# Patient Record
Sex: Male | Born: 1985 | Race: Black or African American | Hispanic: No | Marital: Single | Smoking: Former smoker
Health system: Southern US, Community
[De-identification: ages and names within clinical notes are randomized; demographics above are authoritative.]

## PROBLEM LIST (undated history)

## (undated) DIAGNOSIS — F32A Depression, unspecified: Secondary | ICD-10-CM

## (undated) DIAGNOSIS — F419 Anxiety disorder, unspecified: Secondary | ICD-10-CM

## (undated) HISTORY — PX: APPENDECTOMY: SHX54

---

## 2004-03-22 ENCOUNTER — Emergency Department (HOSPITAL_COMMUNITY): Admission: EM | Admit: 2004-03-22 | Discharge: 2004-03-22 | Payer: Self-pay | Admitting: Family Medicine

## 2007-07-26 ENCOUNTER — Emergency Department (HOSPITAL_COMMUNITY): Admission: EM | Admit: 2007-07-26 | Discharge: 2007-07-26 | Payer: Self-pay | Admitting: Emergency Medicine

## 2009-04-08 ENCOUNTER — Emergency Department (HOSPITAL_COMMUNITY): Admission: EM | Admit: 2009-04-08 | Discharge: 2009-04-08 | Payer: Self-pay | Admitting: Family Medicine

## 2011-03-07 LAB — INFLUENZA A+B VIRUS AG-DIRECT(RAPID)
Inflenza A Ag: NEGATIVE
Influenza B Ag: NEGATIVE

## 2011-03-07 LAB — RAPID STREP SCREEN (MED CTR MEBANE ONLY): Streptococcus, Group A Screen (Direct): NEGATIVE

## 2011-05-24 ENCOUNTER — Encounter: Payer: Self-pay | Admitting: Nurse Practitioner

## 2011-05-24 ENCOUNTER — Emergency Department (HOSPITAL_COMMUNITY)
Admission: EM | Admit: 2011-05-24 | Discharge: 2011-05-24 | Discharge status: Against Medical Advice | Payer: Self-pay | Attending: Emergency Medicine | Admitting: Emergency Medicine

## 2011-05-24 DIAGNOSIS — Z0389 Encounter for observation for other suspected diseases and conditions ruled out: Secondary | ICD-10-CM | POA: Insufficient documentation

## 2011-05-24 NOTE — ED Notes (Signed)
States "Feeling funny" in his pelvic area x 2 days. Wants std check

## 2011-05-24 NOTE — ED Notes (Signed)
Called for pt with no answer in triage and wait

## 2011-05-24 NOTE — ED Notes (Signed)
Pt called in triage waiting area as well as main ED waiting area with no response 

## 2011-05-24 NOTE — ED Notes (Signed)
Called patient X 2 no answer

## 2011-05-29 ENCOUNTER — Encounter (HOSPITAL_COMMUNITY): Payer: Self-pay | Admitting: Emergency Medicine

## 2011-05-29 ENCOUNTER — Emergency Department (HOSPITAL_COMMUNITY)
Admission: EM | Admit: 2011-05-29 | Discharge: 2011-05-29 | Disposition: A | Discharge status: Home / Self Care | Payer: Self-pay | Attending: Emergency Medicine | Admitting: Emergency Medicine

## 2011-05-29 DIAGNOSIS — R109 Unspecified abdominal pain: Secondary | ICD-10-CM | POA: Insufficient documentation

## 2011-05-29 DIAGNOSIS — N451 Epididymitis: Secondary | ICD-10-CM

## 2011-05-29 DIAGNOSIS — N453 Epididymo-orchitis: Secondary | ICD-10-CM | POA: Insufficient documentation

## 2011-05-29 DIAGNOSIS — N509 Disorder of male genital organs, unspecified: Secondary | ICD-10-CM | POA: Insufficient documentation

## 2011-05-29 LAB — URINALYSIS, ROUTINE W REFLEX MICROSCOPIC
Bilirubin Urine: NEGATIVE
Glucose, UA: NEGATIVE mg/dL
Ketones, ur: 15 mg/dL — AB
pH: 6 (ref 5.0–8.0)

## 2011-05-29 LAB — URINE MICROSCOPIC-ADD ON

## 2011-05-29 MED ORDER — CEFTRIAXONE SODIUM 250 MG IJ SOLR
250.0000 mg | Freq: Once | INTRAMUSCULAR | Status: AC
  Administered 2011-05-29: 250 mg via INTRAMUSCULAR
  Filled 2011-05-29: qty 250

## 2011-05-29 MED ORDER — DOXYCYCLINE HYCLATE 100 MG PO CAPS: 100.0000 mg | ORAL_CAPSULE | Freq: Two times a day (BID) | ORAL | Status: AC

## 2011-05-29 NOTE — ED Notes (Signed)
Pt c/o lower abd pain.  Thinks he has STD

## 2011-05-29 NOTE — ED Notes (Signed)
Pt here with c/o lower abd pain that started on Monday.  Pt reports recent unsafe sex and worries that he may have an std.  Pt alert and oriented x4 and in NAD at this time.

## 2011-05-29 NOTE — ED Notes (Signed)
Resident at the bedside. Pt had several questions regarding discharge. Sent home with prescription. No signs of distress at the time.

## 2011-05-29 NOTE — ED Provider Notes (Signed)
History     CSN: [id number] Arrival date & time: 05/29/2011  3:51 PM   First MD Initiated Contact with Patient 05/29/11 1647      Chief Complaint  Patient presents with  . Abdominal Pain    (Consider location/radiation/quality/duration/timing/severity/associated sxs/prior treatment) The history is provided by the patient.   patient is a 25 year old male who presents with genital discomfort. This started about 4 days ago. Was initially mild. It has been constant and waxes and wanes. He describes the discomfort as mild right testicular pain that sometimes radiates up towards his suprapubic area. He also had notes intermittent unusual sensation in his skin over his genitalia. He denies associated dysuria, urethral discharge, difficulty urinating, nausea, vomiting, diarrhea, fever, rash, arthralgias, systemic chills. He had unprotected intercourse with a male about 10 days prior to arrival. Otherwise, patient typically uses condoms. Overall severity is described as mild.  Nothing noted to make it better or worse.  Patient did have urinary GC and Chlamydia screen done student services 2 days ago. Also had HIV testing which he reports was negative.  History reviewed. No pertinent past medical history.  History reviewed. No pertinent past surgical history.  No family history on file.  History  Substance Use Topics  . Smoking status: Current Everyday Smoker -- 1.0 packs/day  . Smokeless tobacco: Not on file  . Alcohol Use: No     rare      Review of Systems  Constitutional: Negative for fever, chills and activity change.  HENT: Negative for congestion and neck pain.   Respiratory: Negative for cough, chest tightness, shortness of breath and wheezing.   Cardiovascular: Negative for chest pain.  Gastrointestinal: Negative for nausea, vomiting, abdominal pain, diarrhea and abdominal distention.  Genitourinary: Negative for difficulty urinating.  Musculoskeletal: Negative for gait  problem.  Skin: Negative for rash.  Neurological: Negative for weakness and numbness.  Psychiatric/Behavioral: Negative for behavioral problems and confusion.  All other systems reviewed and are negative.    Allergies  Review of patient's allergies indicates no known allergies.  Home Medications   Current Outpatient Rx  Name Route Sig Dispense Refill  . DOXYCYCLINE HYCLATE 100 MG PO CAPS Oral Take 1 capsule (100 mg total) by mouth 2 (two) times daily. 20 capsule 0    BP 120/69  Pulse 56  Temp(Src) 98 F (36.7 C) (Oral)  Resp 16  SpO2 100%  Physical Exam  Nursing note and vitals reviewed. Constitutional: He is oriented to person, place, and time. He appears well-developed and well-nourished. No distress.  HENT:  Head: Normocephalic.  Nose: Nose normal.  Eyes: EOM are normal. Pupils are equal, round, and reactive to light.  Neck: Normal range of motion.  Cardiovascular: Regular rhythm and intact distal pulses.   Pulmonary/Chest: Effort normal. No respiratory distress.  Abdominal: He exhibits no distension. There is no tenderness.       No visible injury  Genitourinary: Penis normal. No penile tenderness.       No lesions noted on penis or other genitalia. No penile discharge. Mild tenderness to palpation over right epididymis. No testicular asymmetry. No masses noted. No left testicular tenderness.  Musculoskeletal: Normal range of motion. He exhibits no edema and no tenderness.  Neurological: He is alert and oriented to person, place, and time.       Normal strength  Skin: Skin is warm and dry. No rash noted. He is not diaphoretic.  Psychiatric: He has a normal mood and affect. His behavior is normal.  Thought content normal.   no inguinal hernias  ED Course  Procedures (including critical care time)  Labs Reviewed  URINALYSIS, ROUTINE W REFLEX MICROSCOPIC - Abnormal; Notable for the following:    Ketones, ur 15 (*)    Protein, ur 30 (*)    All other components  within normal limits  URINE MICROSCOPIC-ADD ON  GC/CHLAMYDIA PROBE AMP, GENITAL   No results found.   1. Epididymitis       MDM   Patient patient here with vague genital complaints and he is concerned he has an STD. On exam his right testicle is mildly tender over his epididymis. No mass or testicular asymmetry noted. The timing and features of patient's complaint are not suggestive of testicular torsion. He also has a soft and nontender abdomen. There is no evidence of intra-abdominal etiology. Patient is at risk of STD based on unprotected intercourse. With this in mind and tenderness over epididymis, will treat as gonococcal epididymitis. We'll also treat for Chlamydia. Patient is concerned that he may have herpes. This is unlikely. Patient has not had classic prodromal symptoms, vesicular lesions, nor systemic symptoms. I discussed the inability to test for herpes without lesion to culture. I discussed what to look out for as well as appropriate followup was to health if he remains concerned. Patient overall is well appearing and stable for outpatient follow up.       Milus Glazier 05/30/11 0105

## 2011-05-30 LAB — GC/CHLAMYDIA PROBE AMP, GENITAL
Chlamydia, DNA Probe: NEGATIVE
GC Probe Amp, Genital: NEGATIVE

## 2011-05-30 NOTE — ED Provider Notes (Signed)
  I performed a history and physical examination of Javier Garza and discussed his management with Dr. Zebedee Iba.  I agree with the history, physical, assessment, and plan of care, with the following exceptions: None  I was present for the following procedures: None Time Spent in Critical Care of the patient: None   Manus Rudd, MD 05/30/11 1148

## 2011-10-09 ENCOUNTER — Ambulatory Visit (INDEPENDENT_AMBULATORY_CARE_PROVIDER_SITE_OTHER): Payer: BC Managed Care – PPO | Admitting: Physician Assistant

## 2011-10-09 VITALS — BP 125/80 | HR 70 | Temp 97.8°F | Resp 16 | Ht 72.0 in | Wt 164.6 lb

## 2011-10-09 DIAGNOSIS — N342 Other urethritis: Secondary | ICD-10-CM

## 2011-10-09 LAB — POCT URINALYSIS DIPSTICK
Leukocytes, UA: NEGATIVE
Urobilinogen, UA: 1

## 2011-10-09 LAB — POCT UA - MICROSCOPIC ONLY: Yeast, UA: NEGATIVE

## 2011-10-09 LAB — RPR

## 2011-10-09 NOTE — Progress Notes (Signed)
  Subjective:    Patient ID: Javier Garza, male    DOB: 1986/03/01, 26 y.o.   MRN: [id number]  HPI Javier Garza comes in today requesting STD screening.  Although he has no specific symptoms, he has a sense of penile or urethral irritation on and off for 1 month.  He has had at least 2 partners in that month and had unprotected sex with each.  He states that he has no dysuria, discharge, lesions, pain.  He had STD testing 3 months ago that was normal and included HIV, RPR and Gen Probe.  He says he has never had an STD that he knows of. He is healthy otherwise   Review of Systems As above    Objective:   Physical Exam  Constitutional: He appears well-developed and well-nourished.  Cardiovascular: Normal rate, regular rhythm and normal heart sounds.   Genitourinary: Testes normal and penis normal. Circumcised.       No discharge or irritation noted at meatus.  No lesions noted.  Lymphadenopathy:       Right: No inguinal adenopathy present.       Left: No inguinal adenopathy present.  Skin: Skin is warm.      Results for orders placed in visit on 10/09/11  POCT URINALYSIS DIPSTICK      Component Value Range   Color, UA yellow     Clarity, UA clear     Glucose, UA neg     Bilirubin, UA neg     Ketones, UA neg     Spec Grav, UA 1.015     Blood, UA neg     pH, UA 6.5     Protein, UA 30mg      Urobilinogen, UA 1.0     Nitrite, UA neg     Leukocytes, UA Negative    POCT UA - MICROSCOPIC ONLY      Component Value Range   WBC, Ur, HPF, POC 0-1     RBC, urine, microscopic 0-1     Bacteria, U Microscopic neg     Mucus, UA neg     Epithelial cells, urine per micros 0-1     Crystals, Ur, HPF, POC pos-oxalate     Casts, Ur, LPF, POC neg     Yeast, UA neg         Assessment & Plan:  High Risk Sexual Activity STD screening  Reviewed all tests that can be ordered.  He only wants UriProbe, HIV, RPR and declines Hep B and C, HSV titers.  Encouraged to use condoms every time!!! Encouraged  to hydrate and drink more water.

## 2011-10-10 LAB — HIV ANTIBODY (ROUTINE TESTING W REFLEX): HIV: NONREACTIVE

## 2011-10-10 LAB — GC/CHLAMYDIA PROBE AMP, URINE: Chlamydia, Swab/Urine, PCR: NEGATIVE

## 2011-12-17 ENCOUNTER — Encounter (HOSPITAL_COMMUNITY): Payer: Self-pay | Admitting: Emergency Medicine

## 2011-12-17 ENCOUNTER — Emergency Department (HOSPITAL_COMMUNITY)
Admission: EM | Admit: 2011-12-17 | Discharge: 2011-12-17 | Discharge status: Against Medical Advice | Payer: Self-pay | Attending: Emergency Medicine | Admitting: Emergency Medicine

## 2011-12-17 DIAGNOSIS — F489 Nonpsychotic mental disorder, unspecified: Secondary | ICD-10-CM

## 2011-12-17 DIAGNOSIS — Z008 Encounter for other general examination: Secondary | ICD-10-CM | POA: Insufficient documentation

## 2011-12-17 NOTE — ED Notes (Addendum)
Pt is saying that he is not suicidal and is wanting to leave. Pt reported to Tiffany that he does not have a plan. Pt reported to this nurse that "yesterday was a bad day" and "I feel better today". Dad spoke with police and both Father and Patient decided that they would leave. Tiffany PA aware and dispoed  pt as AMA and pt is not IVC. Pt refused to sign out.

## 2011-12-17 NOTE — ED Notes (Signed)
Pt brought by his father and father reports that he has expressed thoughts of suicide to father and that he has had them for sometime. Today pt is not wanting to be here and saying that he does not intend to harm self. When asked if he has a plan or intent to harm himself he replied "NO". He does affirm what he said to his father yesterday but says that "I feel better today". Tiffany PA is speaking with patient at this time. [employer] and security have been notified of flight risk.

## 2011-12-17 NOTE — ED Provider Notes (Signed)
Medical screening examination/treatment/procedure(s) were performed by non-physician practitioner and as supervising physician I was immediately available for consultation/collaboration.   Taren Toops E Verdia Bolt, MD 12/17/11 1537 

## 2011-12-17 NOTE — ED Provider Notes (Signed)
Dad brought son in to be evaluated by mental health. Prior to examination the patient decided he no longer was agreeable to talk to anyone. I asked the patient if he has been suicidal within the past week and he said no. I asked the father if his son had expressed any SI, HI or reason to believe he would harm himself and the father said no. I told him that without IVC paper work we could not force him to stay unless SI or HI. [employer] spoke with the father about how to take out IVC paper and the patient and father left the ER.  Dorthula Matas, PA 12/17/11 [phone]

## 2011-12-21 ENCOUNTER — Encounter (HOSPITAL_COMMUNITY): Payer: Self-pay

## 2011-12-21 ENCOUNTER — Emergency Department (HOSPITAL_COMMUNITY)
Admission: EM | Admit: 2011-12-21 | Discharge: 2011-12-21 | Disposition: A | Discharge status: Home / Self Care | Payer: Self-pay | Attending: Emergency Medicine | Admitting: Emergency Medicine

## 2011-12-21 DIAGNOSIS — F411 Generalized anxiety disorder: Secondary | ICD-10-CM | POA: Insufficient documentation

## 2011-12-21 DIAGNOSIS — F329 Major depressive disorder, single episode, unspecified: Secondary | ICD-10-CM | POA: Insufficient documentation

## 2011-12-21 DIAGNOSIS — F3289 Other specified depressive episodes: Secondary | ICD-10-CM | POA: Insufficient documentation

## 2011-12-21 DIAGNOSIS — Z87891 Personal history of nicotine dependence: Secondary | ICD-10-CM | POA: Insufficient documentation

## 2011-12-21 HISTORY — DX: Anxiety disorder, unspecified: F41.9

## 2011-12-21 LAB — ETHANOL: Alcohol, Ethyl (B): <11 mg/dL (ref 0–11)

## 2011-12-21 LAB — COMPREHENSIVE METABOLIC PANEL
Albumin: 4.5 g/dL (ref 3.5–5.2)
Alkaline Phosphatase: 58 U/L (ref 39–117)
BUN: 13 mg/dL (ref 6–23)
Calcium: 9.4 mg/dL (ref 8.4–10.5)
Potassium: 4.1 mEq/L (ref 3.5–5.1)
Total Protein: 7.6 g/dL (ref 6.0–8.3)

## 2011-12-21 LAB — CBC
HCT: 42.7 % (ref 39.0–52.0)
MCHC: 36.3 g/dL — ABNORMAL HIGH (ref 30.0–36.0)
RDW: 11.4 % — ABNORMAL LOW (ref 11.5–15.5)

## 2011-12-21 LAB — RAPID URINE DRUG SCREEN, HOSP PERFORMED
Barbiturates: NOT DETECTED
Benzodiazepines: NOT DETECTED
Cocaine: NOT DETECTED
Opiates: NOT DETECTED

## 2011-12-21 LAB — ACETAMINOPHEN LEVEL: Acetaminophen (Tylenol), Serum: <15 ug/mL (ref 10–30)

## 2011-12-21 MED ORDER — ONDANSETRON HCL 4 MG PO TABS: 4.0000 mg | ORAL_TABLET | Freq: Three times a day (TID) | ORAL | Status: DC | PRN

## 2011-12-21 MED ORDER — LORAZEPAM 1 MG PO TABS: 1.0000 mg | ORAL_TABLET | Freq: Three times a day (TID) | ORAL | Status: DC | PRN

## 2011-12-21 MED ORDER — ACETAMINOPHEN 325 MG PO TABS: 650.0000 mg | ORAL_TABLET | ORAL | Status: DC | PRN

## 2011-12-21 MED ORDER — NICOTINE 21 MG/24HR TD PT24: 21.0000 mg | MEDICATED_PATCH | Freq: Every day | TRANSDERMAL | Status: DC

## 2011-12-21 MED ORDER — IBUPROFEN 600 MG PO TABS: 600.0000 mg | ORAL_TABLET | Freq: Three times a day (TID) | ORAL | Status: DC | PRN

## 2011-12-21 MED ORDER — ALUM & MAG HYDROXIDE-SIMETH 200-200-20 MG/5ML PO SUSP: 30.0000 mL | ORAL | Status: DC | PRN

## 2011-12-21 MED ORDER — ZOLPIDEM TARTRATE 5 MG PO TABS: 5.0000 mg | ORAL_TABLET | Freq: Every evening | ORAL | Status: DC | PRN

## 2011-12-21 NOTE — ED Provider Notes (Signed)
Telemedicine psychiatric consult recommends discharge home diagnosis depression mild recommend continuing to take his Prozac 20 mg once a day and recommends followup with his psychiatrist in [place] or locally resource guide provided to help him find a local provider. According to the psychiatrist no need for admission.  Shelda Jakes, MD 12/21/11 2125

## 2011-12-21 NOTE — ED Notes (Signed)
telepsych in progress 

## 2011-12-21 NOTE — ED Notes (Signed)
States would like to be evaluated by [facility]

## 2011-12-21 NOTE — ED Notes (Signed)
States started a new medication for depression states worried about adverse effects and would like to speak with a dr. Andrey Cota increased depression states been on medication for almost 1 year however doesn't take it regularly, states some s/i in the past 2 weeks, at the present time denies s/i denies h/i denies pain or discomfort

## 2011-12-21 NOTE — ED Notes (Signed)
Patient has on blue scrubs and red socks and was wanded by security.  Patient had one personal belonging bag and parents took it with them.

## 2011-12-21 NOTE — ED Notes (Signed)
ACT-greg in w/ pt

## 2011-12-21 NOTE — ED Notes (Signed)
No belongings-family has them

## 2011-12-21 NOTE — ED Notes (Signed)
Pt and family are upset about the response of the psychatrist during the telepsych eval. Dr Deretha Emory aware and will talk with them after he receives the recommendations and talks to the MD.  Family/pt is aware

## 2011-12-21 NOTE — ED Notes (Signed)
Referral faxed to [facility] for medication evaluation.   Janann Colonel., MSW, [facility] Clinical Social Worker [phone]

## 2011-12-21 NOTE — ED Notes (Signed)
Patient's mother spoke with dr Jacky Kindle by phone (with the patients permission).

## 2011-12-21 NOTE — BH Assessment (Signed)
Assessment Note   Javier Garza is an 26 y.o. male who presented to [facility] Emergency Department with the chief complaint of increased depression and anxiety. Pt reported to writer that within the past 2 weeks it has been extremely difficult for him to get out of bed and remain motivated. Pt verbalized to Clinical research associate that he has a diagnosis of anxiety/depression and was prescribed (Fluoxetine 20mg  1 capsule daily) a year ago by a physician. "He didn't tell me the adverse effects that could happen if I did not take them as prescribed." Pt reported his depression and anxiety has increased recently and that he is unsure if he is taking the correct medication. "I've just been really mellow. It's hard for me to go to work and do other things because I'm worrying about the wrong things." Pt stated that his depression, on a scale from 1-10 with 10 being the most severe, is currently at a 5.  Pt did endorse that he does not consume his medication as directed. Pt desires for his psychotropic medications to be evaluated by a psychiatrist and to be provided information about his current medication. Patient denies SI/HI/AVH at this time.   Axis I: Mood Disorder NOS Axis II: Deferred Axis III:  Past Medical History  Diagnosis Date  . Anxiety    Axis IV: educational problems, housing problems, other psychosocial or environmental problems and problems related to social environment Axis V: 41-50 serious symptoms  Past Medical History:  Past Medical History  Diagnosis Date  . Anxiety     History reviewed. No pertinent past surgical history.  Family History: No family history on file.  Social History:  reports that he has quit smoking. His smoking use included Cigarettes. He smoked 1 pack per day. He quit smokeless tobacco use about a year ago. He reports that he does not drink alcohol or use illicit drugs.  Additional Social History:  Alcohol / Drug Use History of alcohol / drug use?: Yes Substance #1 Name  of Substance 1: THC 1 - Age of First Use: teens  1 - Amount (size/oz): varies 1 - Frequency: rarely 1 - Duration: years 1 - Last Use / Amount: Unknown  CIWA: CIWA-Ar BP: 127/74 mmHg Pulse Rate: 68  COWS:    Allergies: No Known Allergies  Home Medications:  (Not in a hospital admission)  OB/GYN Status:  No LMP for male patient.  General Assessment Data Location of Assessment: [facility] ED Living Arrangements: Parent (Recently moved in with father) Can pt return to current living arrangement?: Yes Admission Status: Voluntary Is patient capable of signing voluntary admission?: Yes Transfer from: Home Referral Source: Self/Family/Friend  Education Status Is patient currently in school?: No Current Grade: Junior at [employer] grade of school patient has completed: Junior Name of school: [employer]   Risk to self Suicidal Ideation: No-Not Currently/Within Last 6 Months Suicidal Intent: No Is patient at risk for suicide?: No Suicidal Plan?: No Access to Means: Yes Specify Access to Suicidal Means: Access to Rx's What has been your use of drugs/alcohol within the last 12 months?: THC rarely Previous Attempts/Gestures: No How many times?: 0  Other Self Harm Risks: N/A Triggers for Past Attempts: None known Intentional Self Injurious Behavior: None Family Suicide History: No Recent stressful life event(s): Other (Comment) (Increased depression and anxiety) Persecutory voices/beliefs?: No Depression: Yes Depression Symptoms: Loss of interest in usual pleasures;Feeling worthless/self pity;Isolating Substance abuse history and/or treatment for substance abuse?: No Suicide prevention information given to non-admitted patients:  Not applicable  Risk to Others Homicidal Ideation: No Thoughts of Harm to Others: No Current Homicidal Intent: No Current Homicidal Plan: No Access to Homicidal Means: No Identified Victim: N/A History of harm to others?: No Assessment of  Violence: None Noted Violent Behavior Description: Pt is calm and cooperative Does patient have access to weapons?: No Criminal Charges Pending?: No Does patient have a court date: No  Psychosis Hallucinations: None noted Delusions: None noted  Mental Status Report Appear/Hygiene: Other (Comment) (Appropriate) Eye Contact: Good Motor Activity: Freedom of movement Speech: Logical/coherent Level of Consciousness: Alert Mood: Depressed;Anxious;Sad Affect: Appropriate to circumstance;Depressed;Sad Anxiety Level: None Thought Processes: Coherent;Relevant Judgement: Impaired Orientation: Place;Person;Time;Situation Obsessive Compulsive Thoughts/Behaviors: None  Cognitive Functioning Concentration: Decreased Memory: Recent Intact;Remote Intact IQ: Average Insight: Fair Impulse Control: Fair Appetite: Good Weight Loss: 0  Weight Gain: 0  Sleep: Decreased Vegetative Symptoms: None  ADLScreening [facility] Assessment Services) Patient's cognitive ability adequate to safely complete daily activities?: Yes Patient able to express need for assistance with ADLs?: Yes Independently performs ADLs?: Yes  Abuse/Neglect [facility]) Physical Abuse: Denies Verbal Abuse: Denies Sexual Abuse: Denies  Prior Inpatient Therapy Prior Inpatient Therapy: No  Prior Outpatient Therapy Prior Outpatient Therapy: Yes Prior Therapy Dates: 1 year ago Prior Therapy Facilty/Provider(s): Dr. Wandra Arthurs Reason for Treatment: Depression  ADL Screening (condition at time of admission) Patient's cognitive ability adequate to safely complete daily activities?: Yes Patient able to express need for assistance with ADLs?: Yes Independently performs ADLs?: Yes Weakness of Legs: None Weakness of Arms/Hands: None  Home Assistive Devices/Equipment Home Assistive Devices/Equipment: None  Therapy Consults (therapy consults require a physician order) PT Evaluation Needed: No OT Evalulation Needed: No SLP  Evaluation Needed: No Abuse/Neglect Assessment (Assessment to be complete while patient is alone) Physical Abuse: Denies Verbal Abuse: Denies Sexual Abuse: Denies Exploitation of patient/patient's resources: Denies Self-Neglect: Denies Values / Beliefs Cultural Requests During Hospitalization: None Spiritual Requests During Hospitalization: None Consults Spiritual Care Consult Needed: No Social Work Consult Needed: No      Additional Information 1:1 In Past 12 Months?: No CIRT Risk: No Elopement Risk: No Does patient have medical clearance?: Yes     Disposition: Referral to Specialist On Call for medication evaluation. Disposition Disposition of Patient: Referred to Type of inpatient treatment program:  (Specialist On Call ) Patient referred to: Other (Comment) (SOC)  On Site Evaluation by:   Reviewed with Physician:     Janann Colonel C 12/21/2011 4:14 PM

## 2011-12-21 NOTE — ED Provider Notes (Signed)
History     CSN: [id number]  Arrival date & time 12/21/11  1308   First MD Initiated Contact with Patient 12/21/11 1444      Chief Complaint  Patient presents with  . Medication Reaction    (Consider location/radiation/quality/duration/timing/severity/associated sxs/prior treatment) HPI Pt presenting with c/o depression.  States he started on a new medication several months ago but states he feels his depression is worsening.  Denies specific suicidal plans, states he is a religous person and does not think "it would come to that".  But has been having some suicidal thoughts.  Denies substance use.  No recent illness.  Denies fever/vomiting, cough, abdominal pain. There are no other associated systemic symptoms, there are no other alleviating or modifying factors.   Past Medical History  Diagnosis Date  . Anxiety     History reviewed. No pertinent past surgical history.  No family history on file.  History  Substance Use Topics  . Smoking status: Former Smoker -- 1.0 packs/day    Types: Cigarettes  . Smokeless tobacco: Former Neurosurgeon    Quit date: 12/09/2010  . Alcohol Use: No     rare      Review of Systems ROS reviewed and all otherwise negative except for mentioned in HPI  Allergies  Review of patient's allergies indicates no known allergies.  Home Medications   Current Outpatient Rx  Name Route Sig Dispense Refill  . FLUOXETINE HCL 20 MG PO CAPS Oral Take 20 mg by mouth daily.      BP 134/76  Pulse 66  Temp 98.2 F (36.8 C) (Oral)  Resp 20  SpO2 100% Vitals reviewed Physical Exam Physical Examination: General appearance - alert, well appearing, and in no distress Mental status - alert, oriented to person, place, and time Eyes - no conjunctival injection, no scleral icterus Mouth - mucous membranes moist, pharynx normal without lesions Chest - clear to auscultation, no wheezes, rales or rhonchi, symmetric air entry Heart - normal rate, regular rhythm,  normal S1, S2, no murmurs, rubs, clicks or gallops Abdomen - soft, nontender, nondistended, no masses or organomegaly Neurological - alert, oriented, normal speech, strength and cranial nerves grossly intact Musculoskeletal - no joint tenderness, deformity or swelling Extremities - peripheral pulses normal, no pedal edema, no clubbing or cyanosis Skin - normal coloration and turgor, no rashes Pscyh- flat affect, cooperative  ED Course  Procedures (including critical care time)  3:08 PM  D/w ACT team, pt will need telepsych consult.   Labs Reviewed  CBC - Abnormal; Notable for the following:    MCHC 36.3 (*)     RDW 11.4 (*)     All other components within normal limits  COMPREHENSIVE METABOLIC PANEL - Abnormal; Notable for the following:    GFR calc non Af Amer 76 (*)     GFR calc Af Amer 88 (*)     All other components within normal limits  URINE RAPID DRUG SCREEN (HOSP PERFORMED) - Abnormal; Notable for the following:    Tetrahydrocannabinol POSITIVE (*)     All other components within normal limits  ETHANOL  ACETAMINOPHEN LEVEL  LAB REPORT - SCANNED   No results found.   1. Depression       MDM  Pt presenting with worsening symptoms of depression depsite po meds.  D/w ACT team and telepsych consult requested to help with disposition.  Psych holding orders written        Ethelda Chick, MD 12/22/11 [phone]

## 2013-02-15 ENCOUNTER — Observation Stay (HOSPITAL_COMMUNITY)
Admission: EM | Admit: 2013-02-15 | Discharge: 2013-02-16 | Disposition: A | Discharge status: Home / Self Care | Payer: Self-pay | Attending: General Surgery | Admitting: General Surgery

## 2013-02-15 ENCOUNTER — Emergency Department (HOSPITAL_COMMUNITY): Payer: Self-pay

## 2013-02-15 ENCOUNTER — Encounter (HOSPITAL_COMMUNITY)
Admission: EM | Disposition: A | Discharge status: Home / Self Care | Payer: Self-pay | Source: Home / Self Care | Attending: Emergency Medicine

## 2013-02-15 ENCOUNTER — Encounter (HOSPITAL_COMMUNITY): Payer: Self-pay | Admitting: Surgery

## 2013-02-15 ENCOUNTER — Encounter (HOSPITAL_COMMUNITY): Payer: Self-pay | Admitting: Anesthesiology

## 2013-02-15 ENCOUNTER — Emergency Department (HOSPITAL_COMMUNITY): Payer: Self-pay | Admitting: Anesthesiology

## 2013-02-15 DIAGNOSIS — K37 Unspecified appendicitis: Secondary | ICD-10-CM

## 2013-02-15 DIAGNOSIS — K3580 Unspecified acute appendicitis: Secondary | ICD-10-CM

## 2013-02-15 HISTORY — PX: LAPAROSCOPIC APPENDECTOMY: SHX408

## 2013-02-15 LAB — LIPASE, BLOOD: Lipase: 19 U/L (ref 11–59)

## 2013-02-15 LAB — COMPREHENSIVE METABOLIC PANEL
AST: 16 U/L (ref 0–37)
Albumin: 4.3 g/dL (ref 3.5–5.2)
Calcium: 9.2 mg/dL (ref 8.4–10.5)
Chloride: 104 mEq/L (ref 96–112)
Creatinine, Ser: 1.05 mg/dL (ref 0.50–1.35)
Total Bilirubin: 1 mg/dL (ref 0.3–1.2)
Total Protein: 6.9 g/dL (ref 6.0–8.3)

## 2013-02-15 LAB — CBC WITH DIFFERENTIAL/PLATELET
Eosinophils Absolute: 0 10*3/uL (ref 0.0–0.7)
Eosinophils Relative: 0 % (ref 0–5)
HCT: 39.2 % (ref 39.0–52.0)
Hemoglobin: 13.9 g/dL (ref 13.0–17.0)
Lymphs Abs: 1.1 10*3/uL (ref 0.7–4.0)
MCH: 32 pg (ref 26.0–34.0)
MCV: 90.3 fL (ref 78.0–100.0)
Monocytes Absolute: 1.1 10*3/uL — ABNORMAL HIGH (ref 0.1–1.0)
Monocytes Relative: 5 % (ref 3–12)
RBC: 4.34 MIL/uL (ref 4.22–5.81)

## 2013-02-15 SURGERY — APPENDECTOMY, LAPAROSCOPIC
Anesthesia: General | Site: Abdomen | Wound class: Contaminated

## 2013-02-15 MED ORDER — ONDANSETRON HCL 4 MG/2ML IJ SOLN
4.0000 mg | Freq: Once | INTRAMUSCULAR | Status: AC
  Administered 2013-02-15: 4 mg via INTRAVENOUS
  Filled 2013-02-15: qty 2

## 2013-02-15 MED ORDER — ONDANSETRON HCL 4 MG PO TABS: 4.0000 mg | ORAL_TABLET | Freq: Four times a day (QID) | ORAL | Status: DC | PRN

## 2013-02-15 MED ORDER — IOHEXOL 300 MG/ML  SOLN
100.0000 mL | Freq: Once | INTRAMUSCULAR | Status: AC | PRN
  Administered 2013-02-15: 100 mL via INTRAVENOUS

## 2013-02-15 MED ORDER — GLYCOPYRROLATE 0.2 MG/ML IJ SOLN
INTRAMUSCULAR | Status: DC | PRN
  Administered 2013-02-15: 0.6 mg via INTRAVENOUS

## 2013-02-15 MED ORDER — MORPHINE SULFATE 2 MG/ML IJ SOLN: 2.0000 mg | INTRAMUSCULAR | Status: DC | PRN

## 2013-02-15 MED ORDER — LORAZEPAM 2 MG/ML IJ SOLN
1.0000 mg | Freq: Once | INTRAMUSCULAR | Status: AC
  Administered 2013-02-15: 1 mg via INTRAVENOUS
  Filled 2013-02-15: qty 1

## 2013-02-15 MED ORDER — MIDAZOLAM HCL 5 MG/5ML IJ SOLN
INTRAMUSCULAR | Status: DC | PRN
  Administered 2013-02-15: 2 mg via INTRAVENOUS

## 2013-02-15 MED ORDER — ONDANSETRON HCL 4 MG/2ML IJ SOLN
INTRAMUSCULAR | Status: DC | PRN
  Administered 2013-02-15: 4 mg via INTRAVENOUS

## 2013-02-15 MED ORDER — HYDROMORPHONE HCL PF 1 MG/ML IJ SOLN
1.0000 mg | Freq: Once | INTRAMUSCULAR | Status: AC
  Administered 2013-02-15: 1 mg via INTRAVENOUS
  Filled 2013-02-15: qty 1

## 2013-02-15 MED ORDER — LACTATED RINGERS IV SOLN: INTRAVENOUS | Status: DC

## 2013-02-15 MED ORDER — IOHEXOL 300 MG/ML  SOLN
50.0000 mL | Freq: Once | INTRAMUSCULAR | Status: AC | PRN
  Administered 2013-02-15: 50 mL via ORAL

## 2013-02-15 MED ORDER — HYDROCODONE-ACETAMINOPHEN 5-325 MG PO TABS
1.0000 | ORAL_TABLET | ORAL | Status: DC | PRN
  Administered 2013-02-15 – 2013-02-16 (×2): 1 via ORAL
  Filled 2013-02-15: qty 2
  Filled 2013-02-15: qty 1

## 2013-02-15 MED ORDER — ONDANSETRON HCL 4 MG/2ML IJ SOLN
4.0000 mg | Freq: Four times a day (QID) | INTRAMUSCULAR | Status: DC | PRN
  Administered 2013-02-15: 4 mg via INTRAVENOUS
  Filled 2013-02-15: qty 2

## 2013-02-15 MED ORDER — FENTANYL CITRATE 0.05 MG/ML IJ SOLN
INTRAMUSCULAR | Status: DC | PRN
  Administered 2013-02-15: 50 ug via INTRAVENOUS

## 2013-02-15 MED ORDER — HYDROMORPHONE HCL PF 1 MG/ML IJ SOLN: 0.2500 mg | INTRAMUSCULAR | Status: DC | PRN

## 2013-02-15 MED ORDER — PROPOFOL 10 MG/ML IV BOLUS
INTRAVENOUS | Status: DC | PRN
  Administered 2013-02-15: 200 mg via INTRAVENOUS

## 2013-02-15 MED ORDER — MORPHINE SULFATE 4 MG/ML IJ SOLN
4.0000 mg | Freq: Once | INTRAMUSCULAR | Status: AC
  Administered 2013-02-15: 4 mg via INTRAVENOUS
  Filled 2013-02-15: qty 1

## 2013-02-15 MED ORDER — DEXAMETHASONE SODIUM PHOSPHATE 10 MG/ML IJ SOLN
INTRAMUSCULAR | Status: DC | PRN
  Administered 2013-02-15: 10 mg via INTRAVENOUS

## 2013-02-15 MED ORDER — BUPIVACAINE-EPINEPHRINE 0.25% -1:200000 IJ SOLN
INTRAMUSCULAR | Status: DC | PRN
  Administered 2013-02-15: 20 mL

## 2013-02-15 MED ORDER — LIDOCAINE HCL (CARDIAC) 20 MG/ML IV SOLN
INTRAVENOUS | Status: DC | PRN
  Administered 2013-02-15: 10 mg via INTRAVENOUS

## 2013-02-15 MED ORDER — ROCURONIUM BROMIDE 100 MG/10ML IV SOLN
INTRAVENOUS | Status: DC | PRN
  Administered 2013-02-15: 30 mg via INTRAVENOUS

## 2013-02-15 MED ORDER — NEOSTIGMINE METHYLSULFATE 1 MG/ML IJ SOLN
INTRAMUSCULAR | Status: DC | PRN
  Administered 2013-02-15: 4 mg via INTRAVENOUS

## 2013-02-15 MED ORDER — IBUPROFEN 600 MG PO TABS
600.0000 mg | ORAL_TABLET | Freq: Four times a day (QID) | ORAL | Status: DC | PRN
  Filled 2013-02-15 (×2): qty 1

## 2013-02-15 MED ORDER — LACTATED RINGERS IV SOLN
INTRAVENOUS | Status: DC | PRN
  Administered 2013-02-15: 1000 mL via INTRAVENOUS

## 2013-02-15 MED ORDER — KCL IN DEXTROSE-NACL 30-5-0.45 MEQ/L-%-% IV SOLN
INTRAVENOUS | Status: DC
  Administered 2013-02-16: 01:00:00 via INTRAVENOUS
  Filled 2013-02-15 (×2): qty 1000

## 2013-02-15 MED ORDER — SODIUM CHLORIDE 0.9 % IV SOLN
3.0000 g | Freq: Once | INTRAVENOUS | Status: AC
  Administered 2013-02-15: 3 g via INTRAVENOUS
  Filled 2013-02-15: qty 3

## 2013-02-15 MED ORDER — LACTATED RINGERS IV SOLN
INTRAVENOUS | Status: DC | PRN
  Administered 2013-02-15 (×2): via INTRAVENOUS

## 2013-02-15 MED ORDER — BUPIVACAINE-EPINEPHRINE PF 0.25-1:200000 % IJ SOLN
INTRAMUSCULAR | Status: AC
  Filled 2013-02-15: qty 30

## 2013-02-15 MED ORDER — SUCCINYLCHOLINE CHLORIDE 20 MG/ML IJ SOLN
INTRAMUSCULAR | Status: DC | PRN
  Administered 2013-02-15: 100 mg via INTRAVENOUS

## 2013-02-15 MED ORDER — SODIUM CHLORIDE 0.9 % IV BOLUS (SEPSIS)
1000.0000 mL | Freq: Once | INTRAVENOUS | Status: AC
  Administered 2013-02-15: 1000 mL via INTRAVENOUS

## 2013-02-15 MED ORDER — SODIUM CHLORIDE 0.9 % IV SOLN
3.0000 g | Freq: Four times a day (QID) | INTRAVENOUS | Status: DC
  Administered 2013-02-16 (×2): 3 g via INTRAVENOUS
  Filled 2013-02-15 (×3): qty 3

## 2013-02-15 SURGICAL SUPPLY — 39 items
APPLIER CLIP ROT 10 11.4 M/L (STAPLE)
BENZOIN TINCTURE PRP APPL 2/3 (GAUZE/BANDAGES/DRESSINGS) ×2 IMPLANT
CANISTER SUCTION 2500CC (MISCELLANEOUS) ×2 IMPLANT
CLIP APPLIE ROT 10 11.4 M/L (STAPLE) IMPLANT
CLOSURE STERI-STRIP 1/4X4 (GAUZE/BANDAGES/DRESSINGS) ×2 IMPLANT
CLOTH BEACON ORANGE TIMEOUT ST (SAFETY) ×2 IMPLANT
CUTTER FLEX LINEAR 45M (STAPLE) ×2 IMPLANT
DECANTER SPIKE VIAL GLASS SM (MISCELLANEOUS) ×2 IMPLANT
DRAPE LAPAROSCOPIC ABDOMINAL (DRAPES) ×2 IMPLANT
ELECT REM PT RETURN 9FT ADLT (ELECTROSURGICAL) ×2
ELECTRODE REM PT RTRN 9FT ADLT (ELECTROSURGICAL) ×1 IMPLANT
ENDOLOOP SUT PDS II  0 18 (SUTURE)
ENDOLOOP SUT PDS II 0 18 (SUTURE) IMPLANT
GAUZE SPONGE 2X2 8PLY STRL LF (GAUZE/BANDAGES/DRESSINGS) ×1 IMPLANT
GLOVE BIOGEL PI IND STRL 7.0 (GLOVE) ×1 IMPLANT
GLOVE BIOGEL PI INDICATOR 7.0 (GLOVE) ×1
GLOVE SURG ORTHO 8.0 STRL STRW (GLOVE) ×2 IMPLANT
GOWN STRL NON-REIN LRG LVL3 (GOWN DISPOSABLE) ×2 IMPLANT
GOWN STRL REIN XL XLG (GOWN DISPOSABLE) ×4 IMPLANT
KIT BASIN OR (CUSTOM PROCEDURE TRAY) ×2 IMPLANT
PENCIL BUTTON HOLSTER BLD 10FT (ELECTRODE) IMPLANT
POUCH SPECIMEN RETRIEVAL 10MM (ENDOMECHANICALS) ×2 IMPLANT
RELOAD 45 VASCULAR/THIN (ENDOMECHANICALS) IMPLANT
RELOAD STAPLE TA45 3.5 REG BLU (ENDOMECHANICALS) ×2 IMPLANT
SCALPEL HARMONIC ACE (MISCELLANEOUS) ×2 IMPLANT
SET IRRIG TUBING LAPAROSCOPIC (IRRIGATION / IRRIGATOR) ×2 IMPLANT
SOLUTION ANTI FOG 6CC (MISCELLANEOUS) ×2 IMPLANT
SPONGE GAUZE 2X2 STER 10/PKG (GAUZE/BANDAGES/DRESSINGS) ×1
STRIP CLOSURE SKIN 1/2X4 (GAUZE/BANDAGES/DRESSINGS) ×2 IMPLANT
SUT MNCRL AB 4-0 PS2 18 (SUTURE) ×2 IMPLANT
TAPE CLOTH SURG 4X10 WHT LF (GAUZE/BANDAGES/DRESSINGS) ×2 IMPLANT
TOWEL OR 17X26 10 PK STRL BLUE (TOWEL DISPOSABLE) ×2 IMPLANT
TRAY FOLEY CATH 14FRSI W/METER (CATHETERS) ×2 IMPLANT
TRAY LAP CHOLE (CUSTOM PROCEDURE TRAY) ×2 IMPLANT
TROCAR BLADELESS OPT 5 75 (ENDOMECHANICALS) ×2 IMPLANT
TROCAR XCEL BLUNT TIP 100MML (ENDOMECHANICALS) ×2 IMPLANT
TROCAR XCEL NON-BLD 11X100MML (ENDOMECHANICALS) ×2 IMPLANT
TUBING INSUFFLATION 10FT LAP (TUBING) ×2 IMPLANT
WATER STERILE IRR 1500ML POUR (IV SOLUTION) ×2 IMPLANT

## 2013-02-15 NOTE — H&P (Signed)
Javier Garza is an 27 y.o. male.    General Surgery [facility]  Chief Complaint: abdominal pain, acute appendicitis  HPI: patient is a 27 year old male who presented to the emergency department with less than 24-hour history of abdominal pain. Patient notes pain was initially epigastric but has moved towards the right lower quadrant. He has had nausea and emesis throughout the day. He remains anorectic.  He denies fever but has had chills and sweats.  Patient was seen and evaluated in the emergency department. White blood cell count is elevated at 20,000. CT scan of abdomen and pelvis shows findings consistent with acute appendicitis. General surgery is now requested for evaluation and management.  Patient has had no prior abdominal surgery. He is on no prescription medications. He has no known drug allergies.  Past Medical History  Diagnosis Date  . Anxiety     History reviewed. No pertinent past surgical history.  No family history on file. Social History:  reports that he has quit smoking. His smoking use included Cigarettes. He smoked 1.00 pack per day. He quit smokeless tobacco use about 2 years ago. He reports that he does not drink alcohol or use illicit drugs.  Allergies:  Allergies  Allergen Reactions  . Lactose Intolerance (Gi)      (Not in a hospital admission)  Results for orders placed during the hospital encounter of 02/15/13 (from the past 48 hour(s))  LIPASE, BLOOD     Status: None   Collection Time    02/15/13  3:41 PM      Result Value Range   Lipase 19  11 - 59 U/L  COMPREHENSIVE METABOLIC PANEL     Status: Abnormal   Collection Time    02/15/13  3:41 PM      Result Value Range   Sodium 138  135 - 145 mEq/L   Potassium 3.3 (*) 3.5 - 5.1 mEq/L   Chloride 104  96 - 112 mEq/L   CO2 24  19 - 32 mEq/L   Glucose, Bld 108 (*) 70 - 99 mg/dL   BUN 11  6 - 23 mg/dL   Creatinine, Ser 1.61  0.50 - 1.35 mg/dL   Calcium 9.2  8.4 - 09.6 mg/dL    Total Protein 6.9  6.0 - 8.3 g/dL   Albumin 4.3  3.5 - 5.2 g/dL   AST 16  0 - 37 U/L   ALT 11  0 - 53 U/L   Alkaline Phosphatase 56  39 - 117 U/L   Total Bilirubin 1.0  0.3 - 1.2 mg/dL   GFR calc non Af Amer >90  >90 mL/min   GFR calc Af Amer >90  >90 mL/min   Comment: (NOTE)     The eGFR has been calculated using the CKD EPI equation.     This calculation has not been validated in all clinical situations.     eGFR's persistently <90 mL/min signify possible Chronic Kidney     Disease.  CBC WITH DIFFERENTIAL     Status: Abnormal   Collection Time    02/15/13  3:41 PM      Result Value Range   WBC 20.4 (*) 4.0 - 10.5 K/uL   RBC 4.34  4.22 - 5.81 MIL/uL   Hemoglobin 13.9  13.0 - 17.0 g/dL   HCT 04.5  40.9 - 81.1 %   MCV 90.3  78.0 - 100.0 fL   MCH 32.0  26.0 - 34.0 pg   MCHC  35.5  30.0 - 36.0 g/dL   RDW 16.1 (*) 09.6 - 04.5 %   Platelets 226  150 - 400 K/uL   Neutrophils Relative % 89 (*) 43 - 77 %   Neutro Abs 18.1 (*) 1.7 - 7.7 K/uL   Lymphocytes Relative 5 (*) 12 - 46 %   Lymphs Abs 1.1  0.7 - 4.0 K/uL   Monocytes Relative 5  3 - 12 %   Monocytes Absolute 1.1 (*) 0.1 - 1.0 K/uL   Eosinophils Relative 0  0 - 5 %   Eosinophils Absolute 0.0  0.0 - 0.7 K/uL   Basophils Relative 0  0 - 1 %   Basophils Absolute 0.0  0.0 - 0.1 K/uL   Ct Abdomen Pelvis W Contrast  02/15/2013   *RADIOLOGY REPORT*  Clinical Data: Diffuse abdominal pain and emesis.  CT ABDOMEN AND PELVIS WITH CONTRAST  Technique:  Multidetector CT imaging of the abdomen and pelvis was performed following the standard protocol during bolus administration of intravenous contrast.  Contrast: 50mL OMNIPAQUE IOHEXOL 300 MG/ML  SOLN, OMNIPAQUE IOHEXOL 300 MG/ML  SOLN  Comparison: No acute abdominal series 02/15/2013  Findings:  Lung bases:  Clear.  Heart size normal.  Abdomen/pelvis:  A paucity of intra-abdominal fat decreases the sensitivity for detecting acute inflammatory changes.  The liver, gallbladder, spleen,  adrenal glands, pancreas, and kidneys are within normal limits.  The common bile duct is normal in caliber.  The stomach is well distended with oral contrast but has a normal appearance.  There is no significant opacification of small bowel loops or colon with contrast at the time examination.  Bowel loops are normal in caliber. In the dependent portion of the pelvis on image 79 through 81 small amount of free fluid is suspected.  Although visualization is challenging due to the lack of oral contrast opacification of bowel loops, and paucity of abdominal fat, there is a dilated tubular structure with enhancing walls that appears to arise from the cecum in the right lower suspicious for acute appendicitis.  The surrounding fat planes are indistinct. This probable appendix measures up to 1.4 cm.  The urinary bladder is not very distended at the time imaging, but bladder wall thickness does measure up to 6 mm.  Consider correlation with urinalysis to exclude cystitis.  No evidence of ureteral dilatation.  The abdominal aorta is normal in caliber. There is no lymphadenopathy or free air.  No acute or suspicious bony abnormality.  IMPRESSION:  1. Findings suspicious for acute appendicitis.  There is a tubular enhancing structure that appears arise to arise from the cecum in the right pelvis and measures up to 1.4 cm. There is a probable small amount of free fluid in the dependent portion of the pelvis. Evaluation of the patient's pelvis is somewhat technically challenging due to  lack of oral contrast within the bowel loops at the time of the examination, and the patient's relative paucity of abdominal fat.  Findings were discussed with Dr. Lynelle Doctor in the emergency department at 7:18 p.m. 02/15/2013.  If the clinical presentation is not suspicious for appendicitis, then delayed imaging through the pelvis after oral contrast after contrast has been confirmed to progress into the region could be considered. 2.  Cannot  exclude urinary bladder wall thickening.  The bladder wall appearance may simply be due to underdistension.  Consider correlation with urinalysis if indicated.   Original Report Authenticated By: Britta Mccreedy, M.D.   Dg Abd Acute W/chest  02/15/2013   CLINICAL DATA:  Abdominal pain and emesis.  EXAM: ACUTE ABDOMEN SERIES (ABDOMEN 2 VIEW & CHEST 1 VIEW)  COMPARISON:  07/26/2007  FINDINGS: The lungs appear clear. The cardiac and mediastinal contours normal.  No pleural effusion identified. No free intraperitoneal gas noted.  Mottled appearance in the right abdomen likely rib right represents stool in the ascending colon. Remainder of the abdomen is gasless.  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  Negative abdominal radiographs.  No acute cardiopulmonary disease.  IMPRESSION: 1. Although a specific bowel gas pattern is not observed, much of the bowel is gasless and accordingly nonspecific by imaging. 2. 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   Electronically Signed   By: Herbie Baltimore   On: 02/15/2013 14:36    Review of Systems  Constitutional: Positive for chills and diaphoresis. Negative for fever, weight loss and malaise/fatigue.  HENT: Negative.   Eyes: Negative.   Respiratory: Negative.   Cardiovascular: Negative.   Gastrointestinal: Positive for nausea, vomiting and abdominal pain.  Genitourinary: Negative.   Musculoskeletal: Negative.   Skin: Negative.   Neurological: Negative.  Negative for weakness.  Endo/Heme/Allergies: Negative.   Psychiatric/Behavioral: Negative.     Blood pressure 138/72, pulse 52, temperature 97.8 F (36.6 C), temperature source Oral, resp. rate 18, SpO2 100.00%. Physical Exam  Constitutional: He is oriented to person, place, and time. He appears well-developed and well-nourished. No distress.  HENT:  Head: Normocephalic and atraumatic.  Right  Ear: External ear normal.  Left Ear: External ear normal.  Mouth/Throat: Oropharynx is clear and moist.  Eyes: Conjunctivae are normal. Pupils are equal, round, and reactive to light. No scleral icterus.  Neck: Normal range of motion. Neck supple. No thyromegaly present.  Cardiovascular: Normal rate and regular rhythm.   Murmur (Grade II systolic murmur) heard. Respiratory: Effort normal and breath sounds normal. No respiratory distress.  GI: Bowel sounds are normal. He exhibits no distension and no mass. There is tenderness (right lower quadrant). There is guarding. There is no rebound.  Musculoskeletal: Normal range of motion. He exhibits no edema.  Lymphadenopathy:    He has no cervical adenopathy.  Neurological: He is alert and oriented to person, place, and time.  Skin: Skin is warm and dry.  Psychiatric: He has a normal mood and affect. His behavior is normal.     Assessment/Plan Acute appendicitis  Plan laparoscopic appendectomy this evening. I have discussed this at length with the patient and his parents at the bedside. They understand and wish to proceed.  The risks and benefits of the procedure have been discussed at length with the patient.  The patient understands the proposed procedure, potential alternative treatments, and the course of recovery to be expected.  All of the patient's questions have been answered at this time.  The patient wishes to proceed with surgery.  Velora Heckler, MD, [facility] Office: [phone]    Javad Salva M 02/15/2013, 8:30 PM

## 2013-02-15 NOTE — ED Notes (Signed)
Pt laying in the triage floor c/o upper abdominal pain with vomiting since last pm

## 2013-02-15 NOTE — ED Provider Notes (Signed)
CSN: [id number]     Arrival date & time 02/15/13  1314 History   First MD Initiated Contact with Patient 02/15/13 1346     Chief Complaint  Patient presents with  . Abdominal Pain  . Emesis   (Consider location/radiation/quality/duration/timing/severity/associated sxs/prior Treatment) Patient is a 27 y.o. male presenting with abdominal pain and vomiting. The history is provided by the patient and a relative. No language interpreter was used.  Abdominal Pain Pain location:  Epigastric Pain quality: sharp   Pain radiates to:  Does not radiate Pain severity:  Severe Onset quality:  Gradual Duration:  1 day Timing:  Constant Progression:  Unchanged Chronicity:  New Context: suspicious food intake   Context: not medication withdrawal, not previous surgeries, not recent illness, not recent sexual activity, not recent travel and not sick contacts   Relieved by: fetal position. Worsened by:  Movement Ineffective treatments:  None tried Associated symptoms: anorexia, constipation, nausea and vomiting   Associated symptoms: no chest pain, no cough, no diarrhea, no dysuria, no fatigue, no fever, no hematuria and no shortness of breath   Risk factors: no alcohol abuse, not elderly, has not had multiple surgeries, no NSAID use, not obese and no recent hospitalization   Emesis Associated symptoms: abdominal pain   Associated symptoms: no diarrhea and no headaches     Past Medical History  Diagnosis Date  . Anxiety    No past surgical history on file. No family history on file. History  Substance Use Topics  . Smoking status: Former Smoker -- 1.00 packs/day    Types: Cigarettes  . Smokeless tobacco: Former Neurosurgeon    Quit date: 12/09/2010  . Alcohol Use: No     Comment: rare    Review of Systems  Constitutional: Negative for fever, activity change, appetite change and fatigue.  HENT: Negative for congestion, facial swelling, rhinorrhea and trouble swallowing.   Eyes: Negative for  photophobia and pain.  Respiratory: Negative for cough, chest tightness and shortness of breath.   Cardiovascular: Negative for chest pain and leg swelling.  Gastrointestinal: Positive for nausea, vomiting, abdominal pain, constipation and anorexia. Negative for diarrhea.  Endocrine: Negative for polydipsia and polyuria.  Genitourinary: Negative for dysuria, urgency, hematuria, decreased urine volume and difficulty urinating.  Musculoskeletal: Negative for back pain and gait problem.  Skin: Negative for color change, rash and wound.  Allergic/Immunologic: Negative for immunocompromised state.  Neurological: Negative for dizziness, facial asymmetry, speech difficulty, weakness, numbness and headaches.  Psychiatric/Behavioral: Negative for confusion, decreased concentration and agitation.    Allergies  Lactose intolerance (gi)  Home Medications  No current outpatient prescriptions on file. BP 138/72  Pulse 52  Temp(Src) 97.8 F (36.6 C) (Oral)  Resp 18  SpO2 100% Physical Exam  Constitutional: He is oriented to person, place, and time. He appears well-developed and well-nourished. No distress.  HENT:  Head: Normocephalic and atraumatic.  Mouth/Throat: No oropharyngeal exudate.  Eyes: Pupils are equal, round, and reactive to light.  Neck: Normal range of motion. Neck supple.  Cardiovascular: Normal rate, regular rhythm and normal heart sounds.  Exam reveals no gallop and no friction rub.   No murmur heard. Pulmonary/Chest: Effort normal and breath sounds normal. No respiratory distress. He has no wheezes. He has no rales.  Abdominal: Soft. Bowel sounds are normal. He exhibits no distension and no mass. There is tenderness. There is guarding (voluntary). There is no rebound.  Musculoskeletal: Normal range of motion. He exhibits no edema and no tenderness.  Neurological:  He is alert and oriented to person, place, and time.  Skin: Skin is warm and dry.  Psychiatric: He has a normal  mood and affect.    ED Course  Procedures (including critical care time) Labs Review Labs Reviewed  COMPREHENSIVE METABOLIC PANEL - Abnormal; Notable for the following:    Potassium 3.3 (*)    Glucose, Bld 108 (*)    All other components within normal limits  CBC WITH DIFFERENTIAL - Abnormal; Notable for the following:    WBC 20.4 (*)    RDW 11.2 (*)    Neutrophils Relative % 89 (*)    Neutro Abs 18.1 (*)    Lymphocytes Relative 5 (*)    Monocytes Absolute 1.1 (*)    All other components within normal limits  LIPASE, BLOOD  H. PYLORI ANTIBODY, IGG   Imaging Review Dg Abd Acute W/chest  02/15/2013   CLINICAL DATA:  Abdominal pain and emesis.  EXAM: ACUTE ABDOMEN SERIES (ABDOMEN 2 VIEW & CHEST 1 VIEW)  COMPARISON:  07/26/2007  FINDINGS: The lungs appear clear. The cardiac and mediastinal contours normal.  No pleural effusion identified. No free intraperitoneal gas noted.  Mottled appearance in the right abdomen likely rib right represents stool in the ascending colon. Remainder of the abdomen is gasless.  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  Negative abdominal radiographs.  No acute cardiopulmonary disease.  IMPRESSION: 1. Although a specific bowel gas pattern is not observed, much of the bowel is gasless and accordingly nonspecific by imaging. 2. 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   Electronically Signed   By: Herbie Baltimore   On: 02/15/2013 14:36     Date: 02/15/2013  Rate: 61  Rhythm: normal sinus rhythm  QRS Axis: normal  Intervals: normal  ST/T Wave abnormalities: nonspecific T wave changes, TW flattening aVL, V2  Conduction Disutrbances:none  Narrative Interpretation:   Old EKG Reviewed: none available    MDM  No diagnosis found. Pt is a 27 y.o. male with Pmhx as above who presents with epigastric pain, nausea vomiting since last night after eating  chinese food.  Pt reports seeing some blood in first episode of emesis, but none since.  Pt somewhat uncooperative w/ exam, has generalized tenderness, voluntary guarding.  Pt laying in floor in triage and in hallway in ED.  AAS shows no acute process.   Labs show WBC elevation, nml LFTs, nml lipase.  Pain not improved after several doses of IV morphine. CT ab/pelvis ordered. Care transferred to Dr.I. Lynelle Doctor.   No diagnosis found.      Shanna Cisco, MD 02/15/13 [phone]

## 2013-02-15 NOTE — ED Provider Notes (Signed)
Patient left a changes shift to get CT scan done and disposition.  Patient initially seen at 1800. More pain medication was ordered. He appear to be painful.  [id number] radiologist called CT report, contrast filled the stomach however it is suspicious for appendicitis.  I reexamined the patient. He complains of diffuse abdominal pain and indicates his pain is worse in the epigastric area however on palpation his pain is consistently worse in the right lower quadrant. When he coughs it hurts in the right lower quadrant. He states when he walks he cannot stand up straight has a pain. I've talked to the patient and his family that his CT is c/w acute appendicitis and his exam is also c/w  appendicitis. I am going to start him on antibiotics and talk to surgery.  19:42 Dr Gerrit Friends, will come admit patient for surgery tonight.   Results for orders placed during the hospital encounter of 02/15/13  LIPASE, BLOOD      Result Value Range   Lipase 19  11 - 59 U/L  COMPREHENSIVE METABOLIC PANEL      Result Value Range   Sodium 138  135 - 145 mEq/L   Potassium 3.3 (*) 3.5 - 5.1 mEq/L   Chloride 104  96 - 112 mEq/L   CO2 24  19 - 32 mEq/L   Glucose, Bld 108 (*) 70 - 99 mg/dL   BUN 11  6 - 23 mg/dL   Creatinine, Ser 1.61  0.50 - 1.35 mg/dL   Calcium 9.2  8.4 - 09.6 mg/dL   Total Protein 6.9  6.0 - 8.3 g/dL   Albumin 4.3  3.5 - 5.2 g/dL   AST 16  0 - 37 U/L   ALT 11  0 - 53 U/L   Alkaline Phosphatase 56  39 - 117 U/L   Total Bilirubin 1.0  0.3 - 1.2 mg/dL   GFR calc non Af Amer >90  >90 mL/min   GFR calc Af Amer >90  >90 mL/min  CBC WITH DIFFERENTIAL      Result Value Range   WBC 20.4 (*) 4.0 - 10.5 K/uL   RBC 4.34  4.22 - 5.81 MIL/uL   Hemoglobin 13.9  13.0 - 17.0 g/dL   HCT 04.5  40.9 - 81.1 %   MCV 90.3  78.0 - 100.0 fL   MCH 32.0  26.0 - 34.0 pg   MCHC 35.5  30.0 - 36.0 g/dL   RDW 91.4 (*) 78.2 - 95.6 %   Platelets 226  150 - 400 K/uL   Neutrophils Relative % 89 (*) 43 - 77 %   Neutro Abs  18.1 (*) 1.7 - 7.7 K/uL   Lymphocytes Relative 5 (*) 12 - 46 %   Lymphs Abs 1.1  0.7 - 4.0 K/uL   Monocytes Relative 5  3 - 12 %   Monocytes Absolute 1.1 (*) 0.1 - 1.0 K/uL   Eosinophils Relative 0  0 - 5 %   Eosinophils Absolute 0.0  0.0 - 0.7 K/uL   Basophils Relative 0  0 - 1 %   Basophils Absolute 0.0  0.0 - 0.1 K/uL   Laboratory interpretation all normal except leukocytosis   Ct Abdomen Pelvis W Contrast  02/15/2013   *RADIOLOGY REPORT*  Clinical Data: Diffuse abdominal pain and emesis.  CT ABDOMEN AND PELVIS WITH CONTRAST  Technique:  Multidetector CT imaging of the abdomen and pelvis was performed following the standard protocol during bolus administration of intravenous contrast.  Contrast: 50mL  OMNIPAQUE IOHEXOL 300 MG/ML  SOLN, OMNIPAQUE IOHEXOL 300 MG/ML  SOLN  Comparison: No acute abdominal series 02/15/2013  Findings:  Lung bases:  Clear.  Heart size normal.  Abdomen/pelvis:  A paucity of intra-abdominal fat decreases the sensitivity for detecting acute inflammatory changes.  The liver, gallbladder, spleen, adrenal glands, pancreas, and kidneys are within normal limits.  The common bile duct is normal in caliber.  The stomach is well distended with oral contrast but has a normal appearance.  There is no significant opacification of small bowel loops or colon with contrast at the time examination.  Bowel loops are normal in caliber. In the dependent portion of the pelvis on image 79 through 81 small amount of free fluid is suspected.  Although visualization is challenging due to the lack of oral contrast opacification of bowel loops, and paucity of abdominal fat, there is a dilated tubular structure with enhancing walls that appears to arise from the cecum in the right lower suspicious for acute appendicitis.  The surrounding fat planes are indistinct. This probable appendix measures up to 1.4 cm.  The urinary bladder is not very distended at the time imaging, but bladder wall  thickness does measure up to 6 mm.  Consider correlation with urinalysis to exclude cystitis.  No evidence of ureteral dilatation.  The abdominal aorta is normal in caliber. There is no lymphadenopathy or free air.  No acute or suspicious bony abnormality.  IMPRESSION:  1. Findings suspicious for acute appendicitis.  There is a tubular enhancing structure that appears arise to arise from the cecum in the right pelvis and measures up to 1.4 cm. There is a probable small amount of free fluid in the dependent portion of the pelvis. Evaluation of the patient's pelvis is somewhat technically challenging due to  lack of oral contrast within the bowel loops at the time of the examination, and the patient's relative paucity of abdominal fat.  Findings were discussed with Dr. Lynelle Doctor in the emergency department at 7:18 p.m. 02/15/2013.  If the clinical presentation is not suspicious for appendicitis, then delayed imaging through the pelvis after oral contrast after contrast has been confirmed to progress into the region could be considered. 2.  Cannot exclude urinary bladder wall thickening.  The bladder wall appearance may simply be due to underdistension.  Consider correlation with urinalysis if indicated.   Original Report Authenticated By: Britta Mccreedy, M.D.   Dg Abd Acute W/chest  02/15/2013   CLINICAL DATA:  Abdominal pain and emesis.  EXAM: ACUTE ABDOMEN SERIES (ABDOMEN 2 VIEW & CHEST 1 VIEW)  COMPARISON:  07/26/2007  FINDINGS: The lungs appear clear. The cardiac and mediastinal contours normal.  No pleural effusion identified. No free intraperitoneal gas noted.  Mottled appearance in the right abdomen likely rib right represents stool in the ascending colon. Remainder of the abdomen is gasless.  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  Negative abdominal radiographs.  No acute cardiopulmonary disease.  IMPRESSION: 1. Although a specific bowel  gas pattern is not observed, much of the bowel is gasless and accordingly nonspecific by imaging. 2. 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   Electronically Signed   By: Herbie Baltimore   On: 02/15/2013 14:36   Final diagnoses:  Appendicitis     Plan admission  Devoria Albe, MD, Franz Dell, MD 02/15/13 [phone]

## 2013-02-15 NOTE — Op Note (Signed)
OPERATIVE REPORT - LAPAROSCOPIC APPENDECTOMY  Preop diagnosis: Acute appendicitis  Postop diagnosis: Same  Procedure: Laparoscopic appendectomy  Surgeon:  Velora Heckler, MD, FACS  Anesthesia: General endotracheal  Estimated blood loss: Minimal  Preparation: Chlora-prep  Complications: None  Indications:  Patient is a 27 yo BM who presented to ER with less than 24 hour history of abdominal pain, nausea, and emesis.  WBC elevated.  CT abdomen consistent with acute appendicitis.  Procedure:  Patient is brought to the operating room and placed in a supine position on the operating room table. Following administration of general anesthesia, a time out was held and the patient's name and type of procedure is confirmed. Patient is then prepped and draped in the usual strict aseptic fashion.  After ascertaining that an adequate level of anesthesia been achieved, a peri-umbilical incision is made with a #15 blade. Dissection is carried down to the fascia. Fascia is incised in the midline and the peritoneal cavity is entered cautiously. A #0-vicryl pursestring suture is placed in the fascia. An Hassan cannula is introduced under direct vision and secured with the pursestring suture. Abdomen is insufflated with carbon dioxide. Laparoscope is introduced and the abdomen is explored. Operative ports are placed in the right upper quadrant and left lower quadrant. Appendix is identified. Mesoappendix is divided with the harmonic scalpel. Dissection is carried down to the base of the appendix. The base of the appendix at its junction with the cecal wall was clearly identified. Using an Endo GIA stapler the base of the appendix is transected at its junction with the cecal wall. There is good approximation of tissue along the staple line. There is good hemostasis along the staple line. The appendix is placed into an endo-catch bag and withdrawn through the umbilical port without difficulty. The #0-vicryl  pursestring suture is tied securely.  Right lower quadrant is irrigated with warm saline which is evacuated. Good hemostasis is noted. Ports are removed under direct vision. Good hemostasis is noted at the port sites. Pneumoperitoneum is released.  Skin incisions are anesthetized with local anesthetic. Wounds are closed with interrupted 4-0 Monocryl subcuticular sutures. Wounds were washed and dried and benzoin and Steri-Strips are applied. Sterile dressings are applied. Patient is awakened from anesthesia and brought to the recovery room. The patient tolerated the procedure well.  Velora Heckler, MD, [facility] Office: [phone]

## 2013-02-15 NOTE — Anesthesia Preprocedure Evaluation (Signed)
Anesthesia Evaluation  Patient identified by MRN, date of birth, ID band Patient awake    Reviewed: Allergy & Precautions, H&P , NPO status , Patient's Chart, lab work & pertinent test results  Airway Mallampati: II TM Distance: >3 FB Neck ROM: full    Dental no notable dental hx.    Pulmonary neg pulmonary ROS,  breath sounds clear to auscultation  Pulmonary exam normal       Cardiovascular Exercise Tolerance: Good negative cardio ROS  Rhythm:regular Rate:Normal     Neuro/Psych Anxiety negative neurological ROS  negative psych ROS   GI/Hepatic negative GI ROS, Neg liver ROS,   Endo/Other  negative endocrine ROS  Renal/GU negative Renal ROS  negative genitourinary   Musculoskeletal   Abdominal   Peds  Hematology negative hematology ROS (+)   Anesthesia Other Findings   Reproductive/Obstetrics negative OB ROS                           Anesthesia Physical Anesthesia Plan  ASA: I and emergent  Anesthesia Plan: General   Post-op Pain Management:    Induction: Rapid sequence, Cricoid pressure planned and Intravenous  Airway Management Planned: Oral ETT  Additional Equipment:   Intra-op Plan:   Post-operative Plan: Extubation in OR  Informed Consent: I have reviewed the patients History and Physical, chart, labs and discussed the procedure including the risks, benefits and alternatives for the proposed anesthesia with the patient or authorized representative who has indicated his/her understanding and acceptance.   Dental Advisory Given  Plan Discussed with: CRNA and Surgeon  Anesthesia Plan Comments:         Anesthesia Quick Evaluation

## 2013-02-15 NOTE — Progress Notes (Signed)
Patient confirms that he does not have a pcp or insurance.  [facility] provided patient with list of pcps who accept self pay patients, list of discount pharmacies and website needymeds.org for medication assistance, information on Medicaid and Affordable Care Act for insurance, list of financial assistance in the community such as [employer] and local churches.  No further needs at this time.

## 2013-02-15 NOTE — Transfer of Care (Signed)
Immediate Anesthesia Transfer of Care Note  Patient: Javier Garza  Procedure(s) Performed: Procedure(s): APPENDECTOMY LAPAROSCOPIC (N/A)  Patient Location: PACU  Anesthesia Type:General  Level of Consciousness: sedated  Airway & Oxygen Therapy: Patient Spontanous Breathing and Patient connected to face mask oxygen  Post-op Assessment: Report given to PACU RN and Post -op Vital signs reviewed and stable  Post vital signs: Reviewed and stable  Complications: No apparent anesthesia complications

## 2013-02-15 NOTE — ED Notes (Signed)
MD at bedside. 

## 2013-02-15 NOTE — Anesthesia Procedure Notes (Signed)
Procedure Name: Intubation Date/Time: 02/15/2013 9:02 PM Performed by: Doran Clay Pre-anesthesia Checklist: Patient identified, Timeout performed, Emergency Drugs available, Suction available and Patient being monitored Patient Re-evaluated:Patient Re-evaluated prior to inductionOxygen Delivery Method: Circle system utilized Preoxygenation: Pre-oxygenation with 100% oxygen Intubation Type: IV induction, Cricoid Pressure applied and Rapid sequence Laryngoscope Size: Mac and 4 Grade View: Grade I Tube type: Oral Tube size: 7.5 mm Number of attempts: 1 Airway Equipment and Method: Stylet Placement Confirmation: ETT inserted through vocal cords under direct vision,  breath sounds checked- equal and bilateral and positive ETCO2 Secured at: 22 cm Tube secured with: Tape Dental Injury: Teeth and Oropharynx as per pre-operative assessment

## 2013-02-15 NOTE — ED Notes (Signed)
Pt laying on floor complaining of pain refusing to get up at nurses request. MD and writer got patient into bed.

## 2013-02-16 ENCOUNTER — Encounter (HOSPITAL_COMMUNITY): Payer: Self-pay | Admitting: *Deleted

## 2013-02-16 ENCOUNTER — Encounter: Payer: Self-pay | Admitting: General Surgery

## 2013-02-16 MED ORDER — HYDROCODONE-ACETAMINOPHEN 5-325 MG PO TABS: 1.0000 | ORAL_TABLET | ORAL | Status: DC | PRN

## 2013-02-16 MED ORDER — IBUPROFEN 200 MG PO TABS: ORAL_TABLET | ORAL | Status: DC

## 2013-02-16 MED ORDER — ACETAMINOPHEN 325 MG PO TABS: ORAL_TABLET | ORAL | Status: DC

## 2013-02-16 NOTE — Care Management Note (Signed)
    Page 1 of 1   02/16/2013     12:55:15 PM   CARE MANAGEMENT NOTE 02/16/2013  Patient:  Javier Garza   Account Number:  [id number]  Date Initiated:  02/16/2013  Documentation initiated by:  Colleen Can  Subjective/Objective Assessment:   dx acute appendicitis; lap appendectomy    PCP-Patrick Cavanaugh in [facility]     Action/Plan:   Plans to go home where he will have support from his grandmother.  Pt states he will be able to afford his prescriptions. States he works full time.   Anticipated DC Date:  02/16/2013   Anticipated DC Plan:  HOME/SELF CARE  In-house referral  Financial Counselor      DC Planning Services  CM consult      Choice offered to / List presented to:             Status of service:  Completed, signed off Medicare Important Message given?   (If response is "NO", the following Medicare IM given date fields will be blank) Date Medicare IM given:   Date Additional Medicare IM given:    Discharge Disposition:  HOME/SELF CARE  Per UR Regulation:  Reviewed for med. necessity/level of care/duration of stay  If discussed at Long Length of Stay Meetings, dates discussed:    Comments:

## 2013-02-16 NOTE — Discharge Summary (Signed)
Agree with summary. 

## 2013-02-16 NOTE — Anesthesia Postprocedure Evaluation (Addendum)
  Anesthesia Post-op Note  Patient: Javier Garza  Procedure(s) Performed: Procedure(s) (LRB): APPENDECTOMY LAPAROSCOPIC (N/A)  Patient Location: PACU  Anesthesia Type: General  Level of Consciousness: awake and alert   Airway and Oxygen Therapy: Patient Spontanous Breathing  Post-op Pain: mild  Post-op Assessment: Post-op Vital signs reviewed, Patient's Cardiovascular Status Stable, Respiratory Function Stable, Patent Airway and No signs of Nausea or vomiting  Last Vitals:  Filed Vitals:   02/16/13 0534  BP: 111/68  Pulse: 74  Temp: 36.9 C  Resp: 16    Post-op Vital Signs: stable   Complications: No apparent anesthesia complications

## 2013-02-16 NOTE — Progress Notes (Signed)
1 Day Post-Op  Subjective: He is doing well this AM.  Ready to eat more.  Overall looks very good.  Objective: Vital signs in last 24 hours: Temp:  [97.8 F (36.6 C)-99.9 F (37.7 C)] 98.4 F (36.9 C) (09/03 0534) Pulse Rate:  [52-88] 74 (09/03 0534) Resp:  [15-19] 16 (09/03 0534) BP: (111-157)/(50-80) 111/68 mmHg (09/03 0534) SpO2:  [95 %-100 %] 98 % (09/03 0534) Weight:  [74.39 kg (164 lb)] 74.39 kg (164 lb) (09/02 2300)  Diet: Clear Tm 99.9 No labs Intake/Output from previous day: 09/02 0701 - 09/03 0700 In: 1837.5 [I.V.:1737.5; IV Piggyback:100] Out: 1360 [Urine:1350; Blood:10] Intake/Output this shift:    General appearance: alert, cooperative and no distress Resp: clear to auscultation bilaterally Cardio: regular rate, rhythm, soft mumur, aortic position GI: soft, no distension, incisions look fine.  Lab Results:   Recent Labs  02/15/13 1541  WBC 20.4*  HGB 13.9  HCT 39.2  PLT 226    BMET  Recent Labs  02/15/13 1541  NA 138  K 3.3*  CL 104  CO2 24  GLUCOSE 108*  BUN 11  CREATININE 1.05  CALCIUM 9.2   PT/INR No results found for this basename: LABPROT, INR,  in the last 72 hours   Recent Labs Lab 02/15/13 1541  AST 16  ALT 11  ALKPHOS 56  BILITOT 1.0  PROT 6.9  ALBUMIN 4.3     Lipase     Component Value Date/Time   LIPASE 19 02/15/2013 1541     Studies/Results: Ct Abdomen Pelvis W Contrast  02/15/2013   *RADIOLOGY REPORT*  Clinical Data: Diffuse abdominal pain and emesis.  CT ABDOMEN AND PELVIS WITH CONTRAST  Technique:  Multidetector CT imaging of the abdomen and pelvis was performed following the standard protocol during bolus administration of intravenous contrast.  Contrast: 50mL OMNIPAQUE IOHEXOL 300 MG/ML  SOLN, OMNIPAQUE IOHEXOL 300 MG/ML  SOLN  Comparison: No acute abdominal series 02/15/2013  Findings:  Lung bases:  Clear.  Heart size normal.  Abdomen/pelvis:  A paucity of intra-abdominal fat decreases the sensitivity for  detecting acute inflammatory changes.  The liver, gallbladder, spleen, adrenal glands, pancreas, and kidneys are within normal limits.  The common bile duct is normal in caliber.  The stomach is well distended with oral contrast but has a normal appearance.  There is no significant opacification of small bowel loops or colon with contrast at the time examination.  Bowel loops are normal in caliber. In the dependent portion of the pelvis on image 79 through 81 small amount of free fluid is suspected.  Although visualization is challenging due to the lack of oral contrast opacification of bowel loops, and paucity of abdominal fat, there is a dilated tubular structure with enhancing walls that appears to arise from the cecum in the right lower suspicious for acute appendicitis.  The surrounding fat planes are indistinct. This probable appendix measures up to 1.4 cm.  The urinary bladder is not very distended at the time imaging, but bladder wall thickness does measure up to 6 mm.  Consider correlation with urinalysis to exclude cystitis.  No evidence of ureteral dilatation.  The abdominal aorta is normal in caliber. There is no lymphadenopathy or free air.  No acute or suspicious bony abnormality.  IMPRESSION:  1. Findings suspicious for acute appendicitis.  There is a tubular enhancing structure that appears arise to arise from the cecum in the right pelvis and measures up to 1.4 cm. There is a probable  small amount of free fluid in the dependent portion of the pelvis. Evaluation of the patient's pelvis is somewhat technically challenging due to  lack of oral contrast within the bowel loops at the time of the examination, and the patient's relative paucity of abdominal fat.  Findings were discussed with Dr. Lynelle Doctor in the emergency department at 7:18 p.m. 02/15/2013.  If the clinical presentation is not suspicious for appendicitis, then delayed imaging through the pelvis after oral contrast after contrast has been  confirmed to progress into the region could be considered. 2.  Cannot exclude urinary bladder wall thickening.  The bladder wall appearance may simply be due to underdistension.  Consider correlation with urinalysis if indicated.   Original Report Authenticated By: Britta Mccreedy, M.D.   Dg Abd Acute W/chest  02/15/2013   CLINICAL DATA:  Abdominal pain and emesis.  EXAM: ACUTE ABDOMEN SERIES (ABDOMEN 2 VIEW & CHEST 1 VIEW)  COMPARISON:  07/26/2007  FINDINGS: The lungs appear clear. The cardiac and mediastinal contours normal.  No pleural effusion identified. No free intraperitoneal gas noted.  Mottled appearance in the right abdomen likely rib right represents stool in the ascending colon. Remainder of the abdomen is gasless.  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  Negative abdominal radiographs.  No acute cardiopulmonary disease.  IMPRESSION: 1. Although a specific bowel gas pattern is not observed, much of the bowel is gasless and accordingly nonspecific by imaging. 2. 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   Electronically Signed   By: Herbie Baltimore   On: 02/15/2013 14:36    Medications: . ampicillin-sulbactam (UNASYN) IV  3 g Intravenous Q6H    Assessment/Plan Acute appendicitis  S/P Laparoscopic appendectomy 02/15/14, Dr. Darnell Level   Plan:  Mobilize, advance diet, hopefully home after lunch.  Mother asking for head nurse and upset about treatment in ER.  No problems voiced this AM, about other care.  I have told them a murmur was noted and should be followed up as an outpatient. K+ is low will give him a regular diet and stop IV fluids for now.       LOS: 1 day    Javier Garza 02/16/2013

## 2013-02-16 NOTE — Progress Notes (Signed)
Patient seen and examined.  He is okay for discharge today.  Discharge instructions given to him.Marland Kitchen

## 2013-02-16 NOTE — Discharge Summary (Signed)
Physician Discharge Summary  Patient ID: Javier Garza MRN: [id number] DOB/AGE: 08/23/1985 27 y.o.  Admit date: 02/15/2013 Discharge date: 02/16/2013  Admission Diagnoses: Acute Appendicitis  Discharge Diagnoses: Same Principal Problem:   Appendicitis, acute   PROCEDURES: Laparoscopic appendectomy 02/15/14, Dr. Alean Rinne Course:  patient is a 27 year old male who presented to the emergency department with less than 24-hour history of abdominal pain. Patient notes pain was initially epigastric but has moved towards the right lower quadrant. He has had nausea and emesis throughout the day. He remains anorectic. He denies fever but has had chills and sweats.  Patient was seen and evaluated in the emergency department. White blood cell count is elevated at 20,000. CT scan of abdomen and pelvis shows findings consistent with acute appendicitis. General surgery is now requested for evaluation and management.  Patient has had no prior abdominal surgery. He is on no prescription medications. He has no known drug allergies. He was seen in the ER by Dr. Gerrit Friends and taken to the OR for the above procedure.  It was a concern he has a systolic murmur at the time of admission.  No prior cardiac issue.  He had surgery and did well post op.  He is sore but able to ambulate and eat without diffuculty. He was ready for discharge by lunch time the following PM. He was instructed by Dr. Abbey Chatters to follow up with primary care to evaluate the heart murmur.  His father was also present. Condition on d/c:  Improved    Disposition: 01-Home or Self Care   Future Appointments Provider Department Dept Phone   03/08/2013 2:15 PM [facility], [place] [phone]       Medication List         acetaminophen 325 MG tablet  Commonly known as:  TYLENOL  Don't take more than 4000 mg of Tylenol (acetaminophen) per day.  Include the tylenol in your prescription pain medicine.      HYDROcodone-acetaminophen 5-325 MG per tablet  Commonly known as:  NORCO/VICODIN  Take 1-2 tablets by mouth every 4 (four) hours as needed.     ibuprofen 200 MG tablet  Commonly known as:  ADVIL,MOTRIN  You can safely take 2-3 tablets every 6 hours.           Follow-up Information   Follow up with [facility] On 03/08/2013. (Your appointment is at 2:15, be there at 1:45 for check in.)    Contact information:   [address]   [place] [address] [phone]       Follow up with [facility], [place]. (Contact him for follow up.  They heard an abnormal heart sound at the time of your admission.)    Specialty:  Family Medicine      Signed: Sherrie Garza 02/16/2013, 11:30 AM

## 2013-03-08 ENCOUNTER — Encounter (INDEPENDENT_AMBULATORY_CARE_PROVIDER_SITE_OTHER): Payer: Self-pay

## 2013-03-29 ENCOUNTER — Encounter (INDEPENDENT_AMBULATORY_CARE_PROVIDER_SITE_OTHER): Payer: Self-pay

## 2013-03-29 ENCOUNTER — Ambulatory Visit (INDEPENDENT_AMBULATORY_CARE_PROVIDER_SITE_OTHER): Payer: Self-pay | Admitting: General Surgery

## 2013-03-29 VITALS — BP 118/72 | HR 68 | Temp 97.9°F | Resp 14 | Ht 74.0 in | Wt 176.6 lb

## 2013-03-29 DIAGNOSIS — K3580 Unspecified acute appendicitis: Secondary | ICD-10-CM

## 2013-03-29 NOTE — Patient Instructions (Signed)
Return as needed

## 2013-03-29 NOTE — Progress Notes (Signed)
Javier Garza [birth year] [id number] 03/29/2013   History of Present Illness: Javier Garza is a  27 y.o. male who presents today status post lap appy by Dr. Velora Heckler, MD, FACS .  Pathology reveals .Appendix, Other than Incidental - ACUTE SUPPURATIVE APPENDICITIS.  The patient is tolerating a regular diet, having normal bowel movements, has good pain control.  He  is back to most normal activities.  He is still having some soreness, but most days are without discomfort.  Physical Exam: BP 118/72  Pulse 68  Temp(Src) 97.9 F (36.6 C) (Temporal)  Resp 14  Ht 6\' 2"  (1.88 m)  Wt 80.105 kg (176 lb 9.6 oz)  BMI 22.66 kg/m2  Abd: soft, nontender, active bowel sounds, nondistended.  All incisions are well healed.  Impression: 1.  Acute appendicitis, s/p lap appy  Plan: He is able to return to normal activities. He  may follow up on a prn basis.

## 2014-09-18 IMAGING — CT CT ABD-PELV W/ CM
1 of 2 series · 14 of 32 positions shown, 18 images · IV contrast (OMNIPAQUE 300)
Comparison: No acute abdominal series 02/15/2013

CLINICAL DATA: Diffuse abdominal pain and emesis.

CT ABDOMEN AND PELVIS WITH CONTRAST
TECHNIQUE: Multidetector CT imaging of the abdomen and pelvis was
performed following the standard protocol during bolus
administration of intravenous contrast.
Contrast: 50mL OMNIPAQUE IOHEXOL 300 MG/ML  SOLN, 100mL OMNIPAQUE
IOHEXOL 300 MG/ML  SOLN

[Series 2: abd/pel with · axial · 0.78mm/px · z∈[+1231,+1661]mm · 14 of 96 slices shown, 18 images]
[im 5/96  soft-tissue]
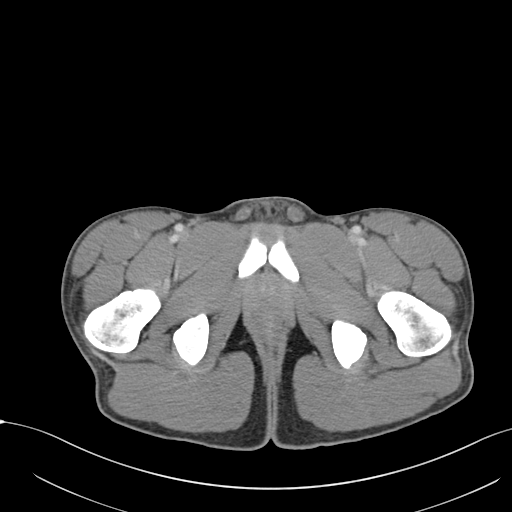
[im 5/96  bone]
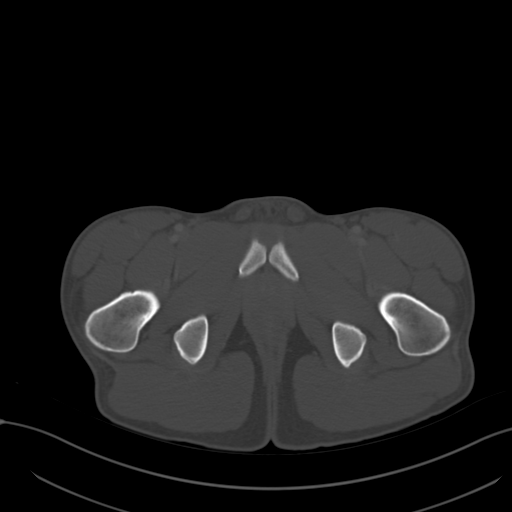
[im 13/96  soft-tissue]
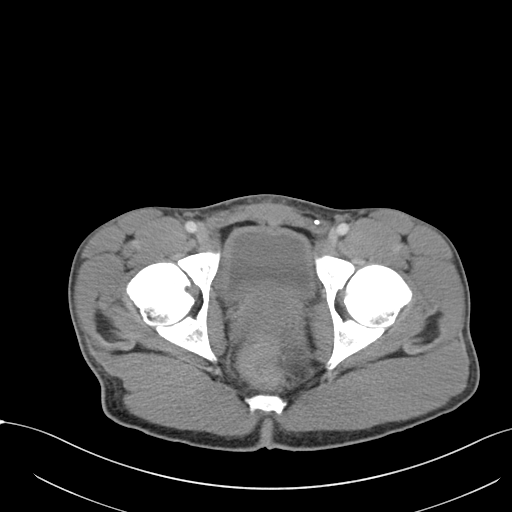
[im 21/96  soft-tissue]
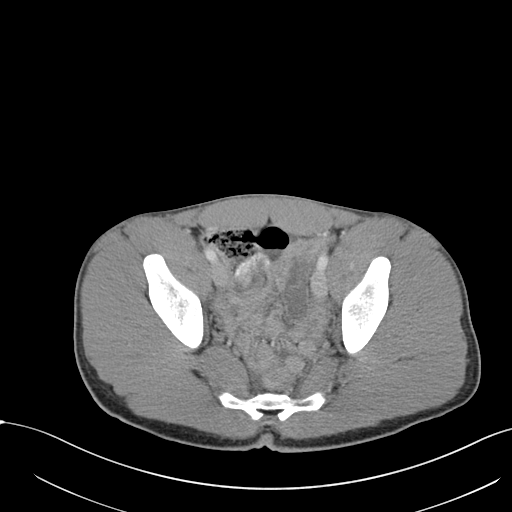
[im 29/96  soft-tissue]
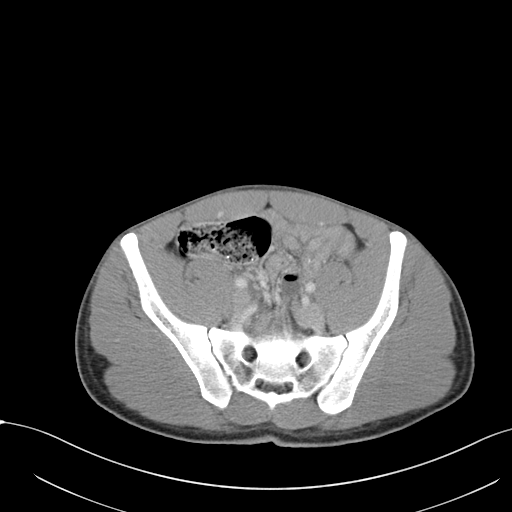
[im 38/96  soft-tissue]
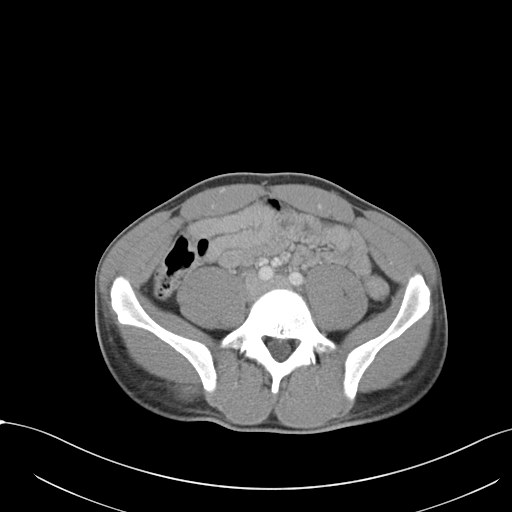
[im 46/96  soft-tissue]
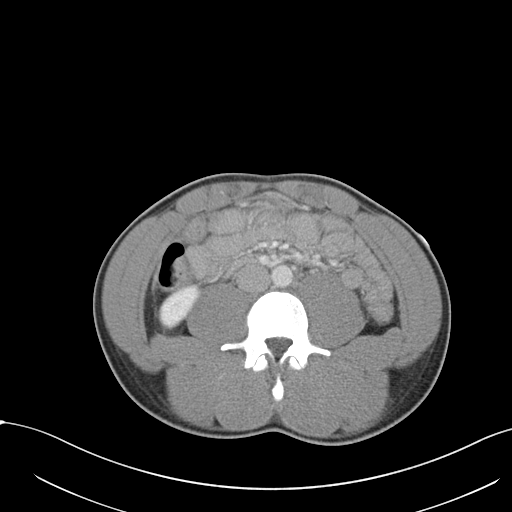
[im 50/96  soft-tissue]
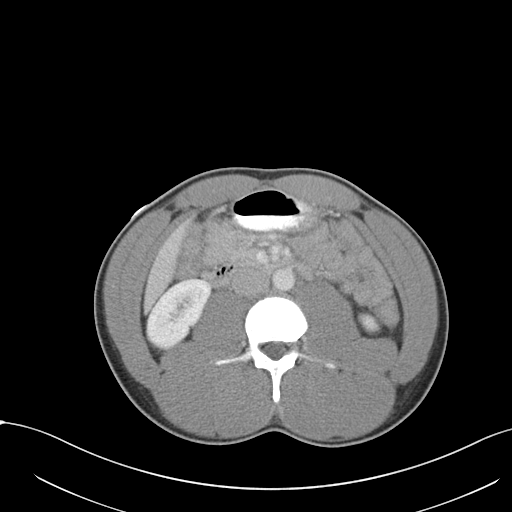
[im 58/96  soft-tissue]
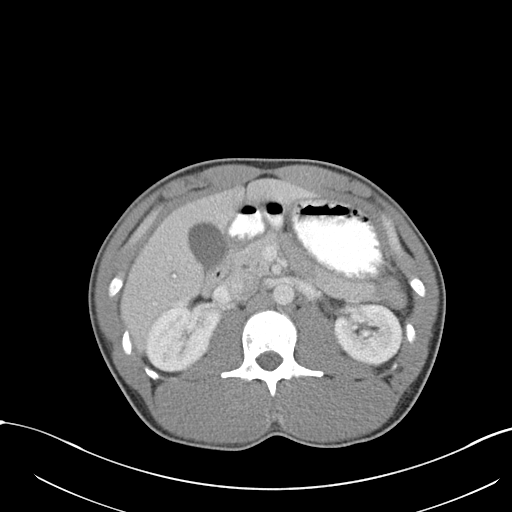
[im 67/96  soft-tissue]
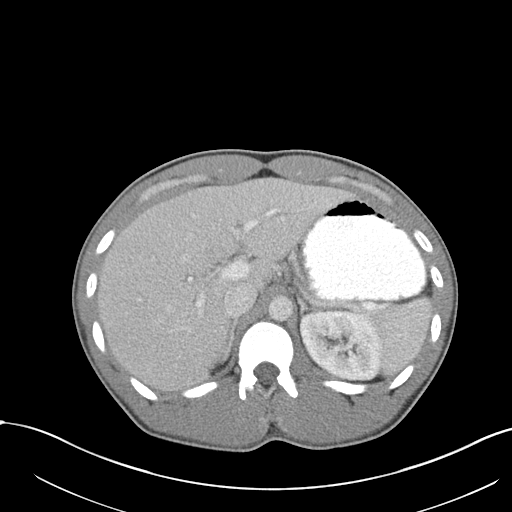
[im 67/96  bone]
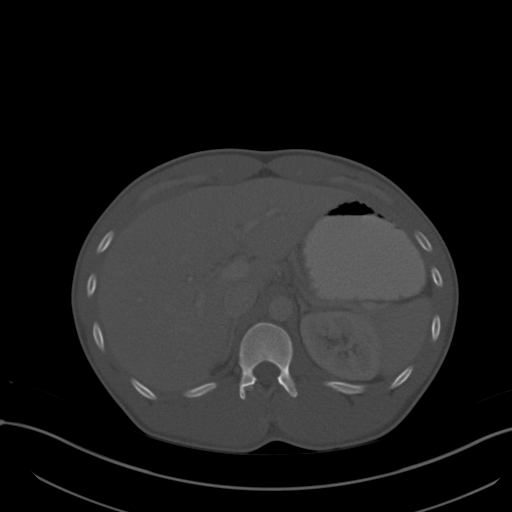
[im 75/96  soft-tissue]
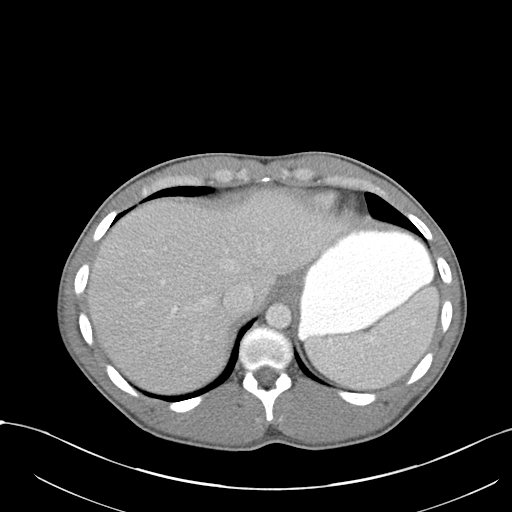
[im 79/96  lung]
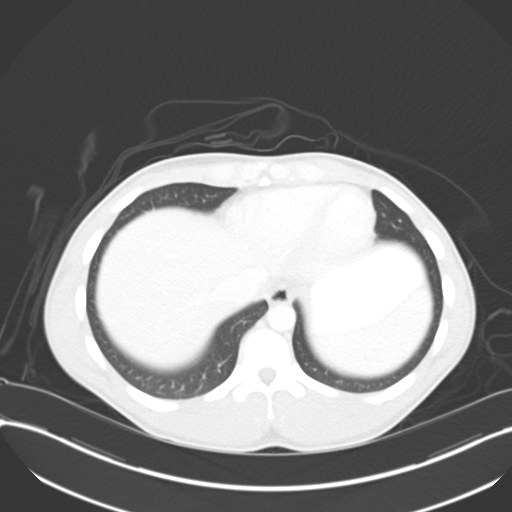
[im 83/96  soft-tissue]
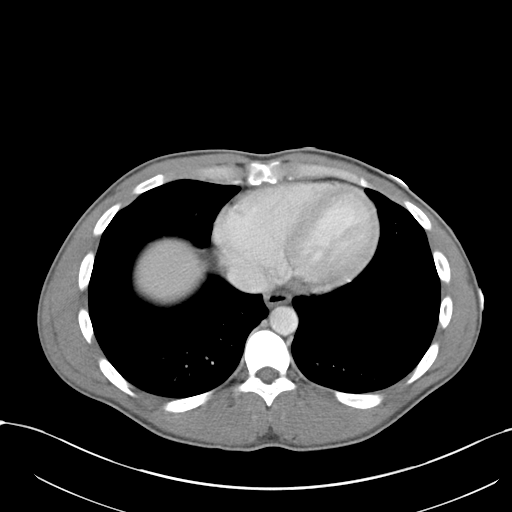
[im 83/96  lung]
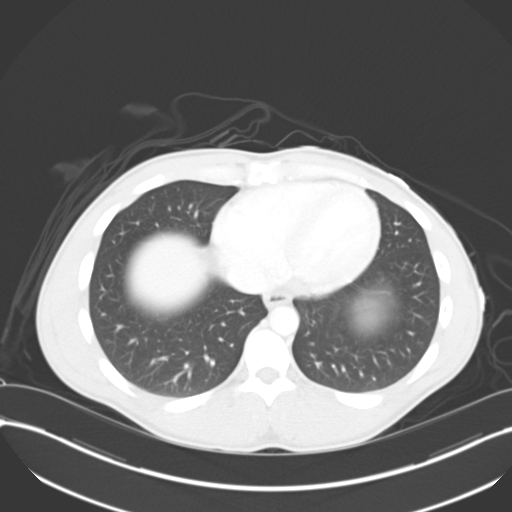
[im 87/96  lung]
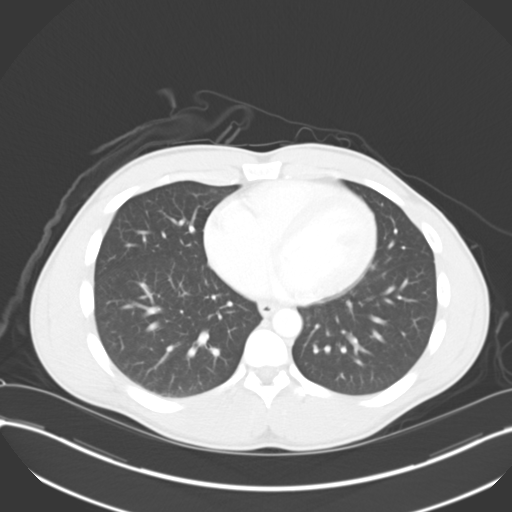
[im 91/96  soft-tissue]
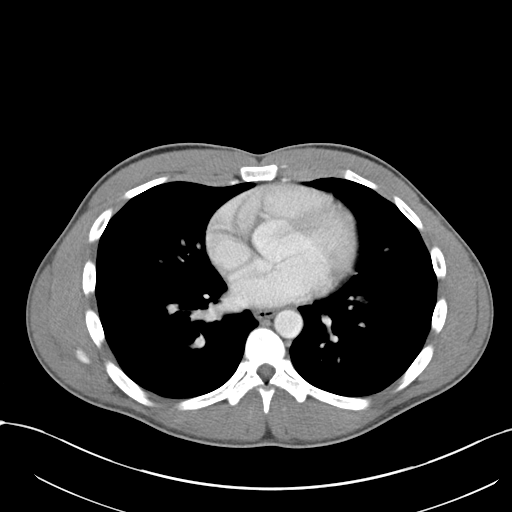
[im 91/96  lung]
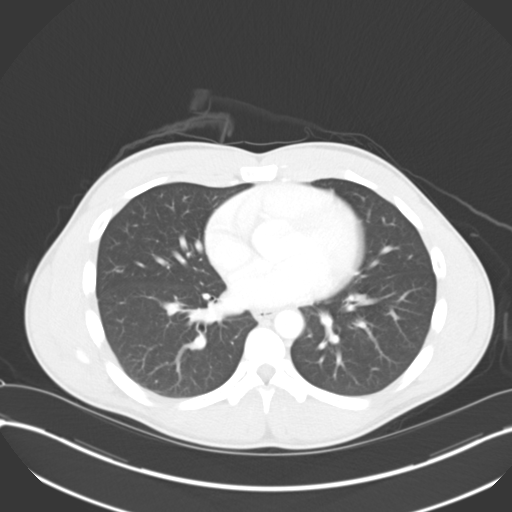

[14 of 32 positions shown; findings below may reference images not displayed]

FINDINGS: Lung bases:  Clear.  Heart size normal.

Abdomen/pelvis:  A paucity of intra-abdominal fat decreases the
sensitivity for detecting acute inflammatory changes.

The liver, gallbladder, spleen, adrenal glands, pancreas, and
kidneys are within normal limits.  The common bile duct is normal
in caliber.

The stomach is well distended with oral contrast but has a normal
appearance.  There is no significant opacification of small bowel
loops or colon with contrast at the time examination.  Bowel loops
are normal in caliber. In the dependent portion of the pelvis on
image 79 through 81 small amount of free fluid is suspected.

Although visualization is challenging due to the lack of oral
contrast opacification of bowel loops, and paucity of abdominal
fat, there is a dilated tubular structure with enhancing walls that
appears to arise from the cecum in the right lower suspicious for
acute appendicitis.  The surrounding fat planes are indistinct.
This probable appendix measures up to 1.4 cm.

The urinary bladder is not very distended at the time imaging, but
bladder wall thickness does measure up to 6 mm.  Consider
correlation with urinalysis to exclude cystitis.  No evidence of
ureteral dilatation.  The abdominal aorta is normal in caliber.
There is no lymphadenopathy or free air.

No acute or suspicious bony abnormality.
IMPRESSION: 1. Findings suspicious for acute appendicitis.  There is a tubular
enhancing structure that appears arise to arise from the cecum in
the right pelvis and measures up to 1.4 cm. There is a probable
small amount of free fluid in the dependent portion of the pelvis.
Evaluation of the patient's pelvis is somewhat technically
challenging due to  lack of oral contrast within the bowel loops at
the time of the examination, and the patient's relative paucity of
abdominal fat.  Findings were discussed with Dr. Arrowood in the
[HOSPITAL] [DATE] p.m. 02/15/2013.  If the clinical
presentation is not suspicious for appendicitis, then delayed
imaging through the pelvis after oral contrast after contrast has
been confirmed to progress into the region could be considered.
2.  Cannot exclude urinary bladder wall thickening.  The bladder
wall appearance may simply be due to underdistension.  Consider
correlation with urinalysis if indicated.

## 2014-09-18 IMAGING — CR DG ABDOMEN ACUTE W/ 1V CHEST
3 series · 3 of 3 positions shown · non-contrast
Comparison: 07/26/2007

CLINICAL DATA: Abdominal pain and emesis.

EXAM:
ACUTE ABDOMEN SERIES (ABDOMEN 2 VIEW & CHEST 1 VIEW)

[w chest pa]
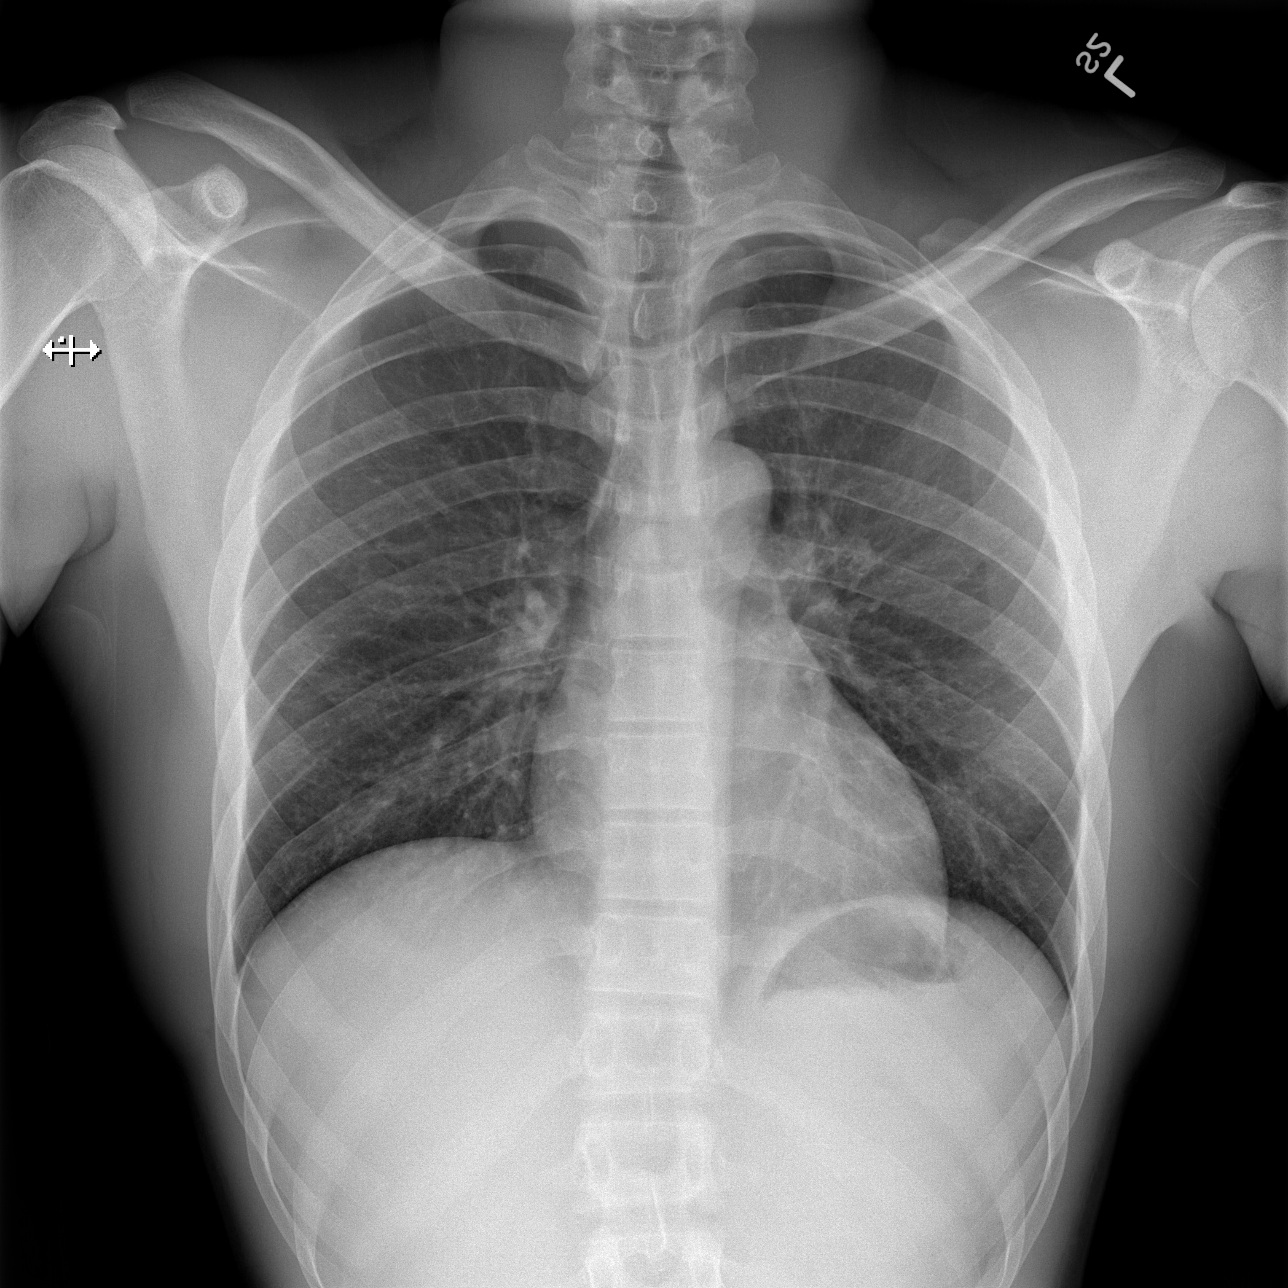

[w abdomen upright]
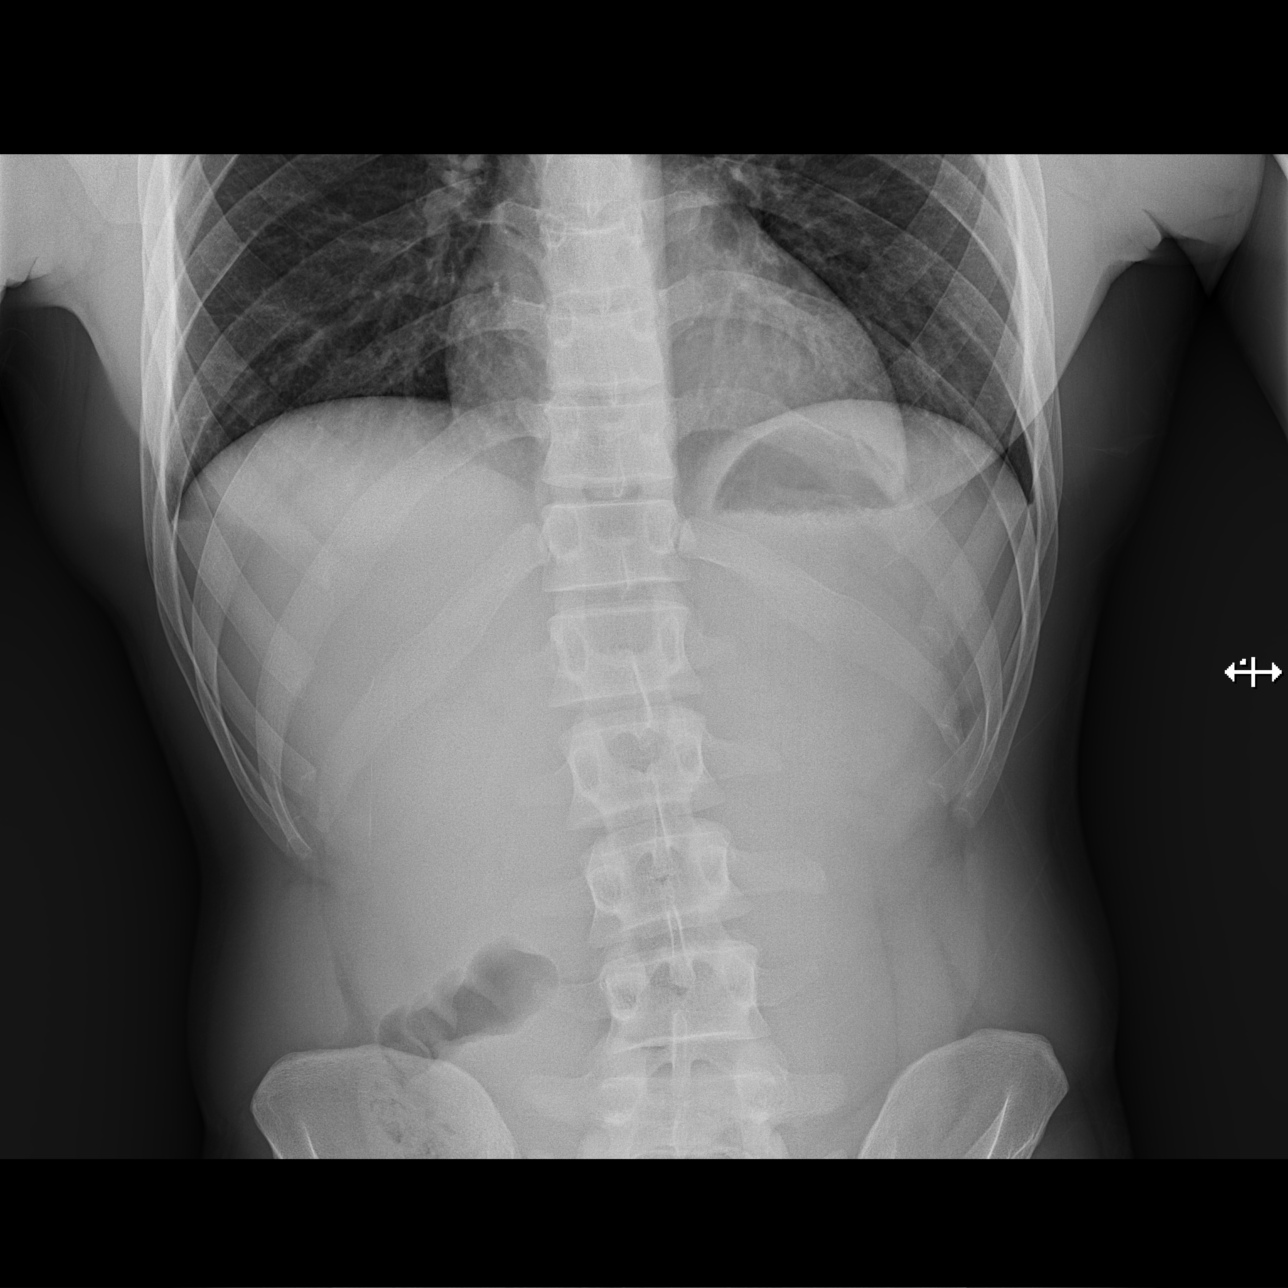

[t abdomen supine]
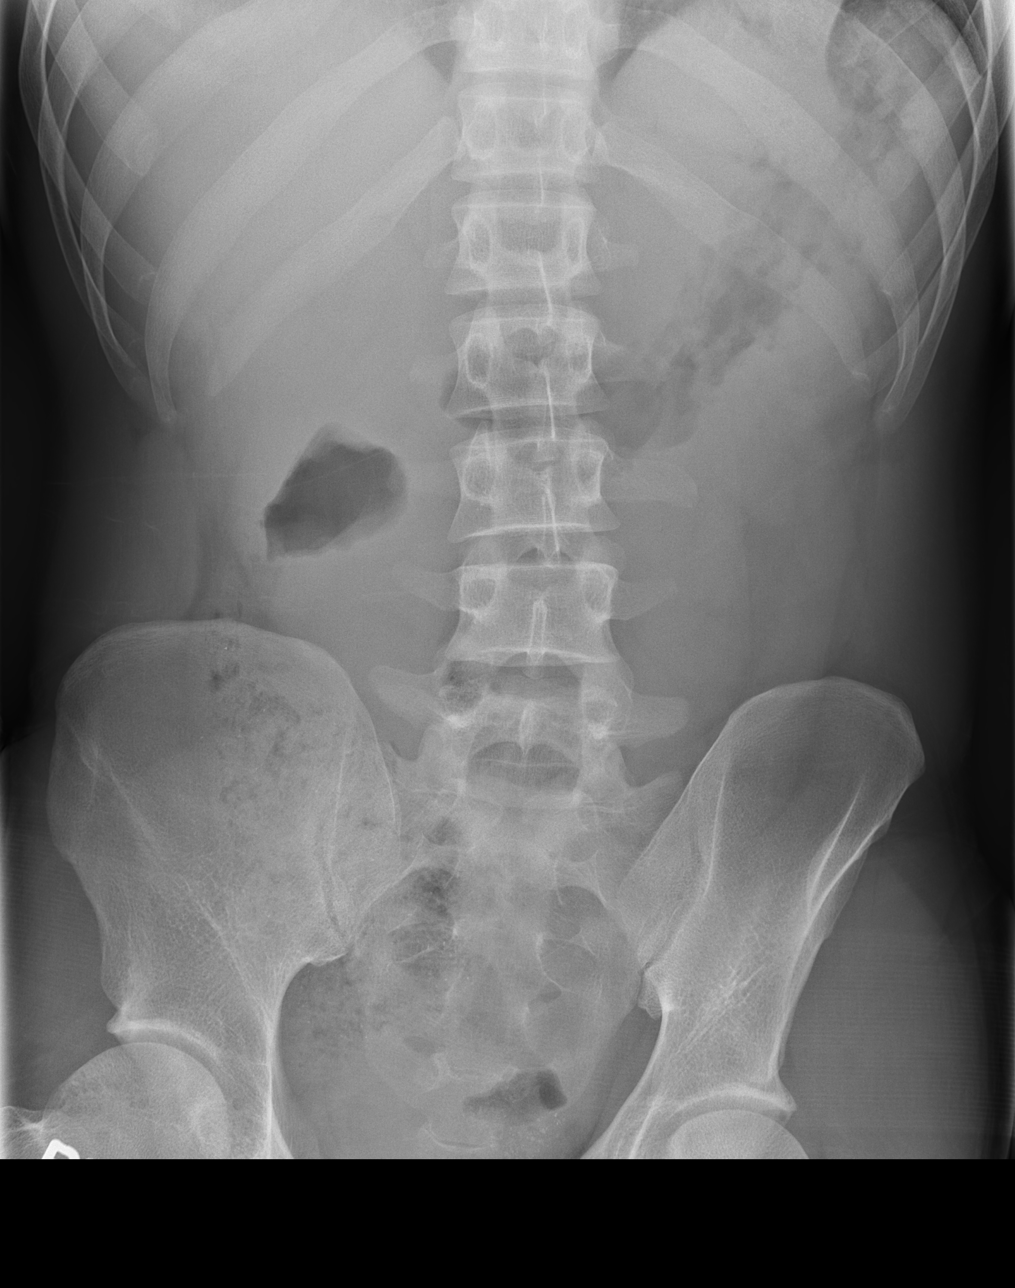

[3 of 3 positions shown; findings below may reference images not displayed]

FINDINGS: The lungs appear clear. The cardiac and mediastinal contours normal.

No pleural effusion identified. No free intraperitoneal gas noted.

Mottled appearance in the right abdomen likely rib right represents
stool in the ascending colon. Remainder of the abdomen is gasless.

There is spurring of both hips with femoral head morphology
predisposing to CAM type femoroacetabular impingement.

Negative abdominal radiographs.  No acute cardiopulmonary disease.
IMPRESSION: 1. Although a specific bowel gas pattern is not observed, much of
the bowel is gasless and accordingly nonspecific by imaging.
2. Femoral head morphology predisposes to CAM type femoroacetabular
impingement.

## 2015-04-20 ENCOUNTER — Encounter (HOSPITAL_COMMUNITY): Payer: Self-pay | Admitting: Emergency Medicine

## 2015-04-20 ENCOUNTER — Emergency Department (INDEPENDENT_AMBULATORY_CARE_PROVIDER_SITE_OTHER)
Admission: EM | Admit: 2015-04-20 | Discharge: 2015-04-20 | Disposition: A | Discharge status: Home / Self Care | Payer: Self-pay | Source: Home / Self Care | Attending: Family Medicine | Admitting: Family Medicine

## 2015-04-20 DIAGNOSIS — R369 Urethral discharge, unspecified: Secondary | ICD-10-CM

## 2015-04-20 DIAGNOSIS — N451 Epididymitis: Secondary | ICD-10-CM

## 2015-04-20 DIAGNOSIS — N50811 Right testicular pain: Secondary | ICD-10-CM

## 2015-04-20 LAB — POCT URINALYSIS DIP (DEVICE)
Bilirubin Urine: NEGATIVE
GLUCOSE, UA: NEGATIVE mg/dL
Hgb urine dipstick: NEGATIVE
Ketones, ur: NEGATIVE mg/dL
LEUKOCYTES UA: NEGATIVE
Nitrite: NEGATIVE
Protein, ur: NEGATIVE mg/dL
SPECIFIC GRAVITY, URINE: 1.01 (ref 1.005–1.030)
UROBILINOGEN UA: 0.2 mg/dL (ref 0.0–1.0)
pH: 7 (ref 5.0–8.0)

## 2015-04-20 MED ORDER — AZITHROMYCIN 250 MG PO TABS
ORAL_TABLET | ORAL | Status: AC
  Filled 2015-04-20: qty 4

## 2015-04-20 MED ORDER — CEFTRIAXONE SODIUM 250 MG IJ SOLR
250.0000 mg | Freq: Once | INTRAMUSCULAR | Status: AC
  Administered 2015-04-20: 250 mg via INTRAMUSCULAR

## 2015-04-20 MED ORDER — CEFTRIAXONE SODIUM 250 MG IJ SOLR
INTRAMUSCULAR | Status: AC
  Filled 2015-04-20: qty 250

## 2015-04-20 MED ORDER — CIPROFLOXACIN HCL 500 MG PO TABS: 500.0000 mg | ORAL_TABLET | Freq: Two times a day (BID) | ORAL | Status: DC

## 2015-04-20 MED ORDER — AZITHROMYCIN 250 MG PO TABS
1000.0000 mg | ORAL_TABLET | Freq: Once | ORAL | Status: AC
  Administered 2015-04-20: 1000 mg via ORAL

## 2015-04-20 MED ORDER — LIDOCAINE HCL (PF) 1 % IJ SOLN
INTRAMUSCULAR | Status: AC
  Filled 2015-04-20: qty 5
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
## 2015-04-20 NOTE — Discharge Instructions (Signed)
Epididymitis °Epididymitis is swelling (inflammation) of the epididymis. The epididymis is a cord-like structure that is located along the top and back part of the testicle. It collects and stores sperm from the testicle. °This condition can also cause pain and swelling of the testicle and scrotum. Symptoms usually start suddenly (acute epididymitis). Sometimes epididymitis starts gradually and lasts for a while (chronic epididymitis). This type may be harder to treat. °CAUSES °In men 35 and younger, this condition is usually caused by a bacterial infection or sexually transmitted disease (STD), such as: °· Gonorrhea. °· Chlamydia.   °In men 35 and older who do not have anal sex, this condition is usually caused by bacteria from a blockage or abnormalities in the urinary system. These can result from: °· Having a tube placed into the bladder (urinary catheter). °· Having an enlarged or inflamed prostate gland. °· Having recent urinary tract surgery. °In men who have a condition that weakens the body's defense system (immune system), such as HIV, this condition can be caused by:  °· Other bacteria, including tuberculosis and syphilis. °· Viruses. °· Fungi. °Sometimes this condition occurs without infection. That may happen if urine flows backward into the epididymis after heavy lifting or straining. °RISK FACTORS °This condition is more likely to develop in men: °· Who have unprotected sex with more than one partner. °· Who have anal sex.   °· Who have recently had surgery.   °· Who have a urinary catheter. °· Who have urinary problems. °· Who have a suppressed immune system.   °SYMPTOMS  °This condition usually begins suddenly with chills, fever, and pain behind the scrotum and in the testicle. Other symptoms include:  °· Swelling of the scrotum, testicle, or both. °· Pain when ejaculating or urinating. °· Pain in the back or belly. °· Nausea. °· Itching and discharge from the penis. °· Frequent need to pass  urine. °· Redness and tenderness of the scrotum. °DIAGNOSIS °Your health care provider can diagnose this condition based on your symptoms and medical history. Your health care provider will also do a physical exam to ask about your symptoms and check your scrotum and testicle for swelling, pain, and redness. You may also have other tests, including:   °· Examination of discharge from the penis. °· Urine tests for infections, such as STDs.   °Your health care provider may test you for other STDs, including HIV.  °TREATMENT °Treatment for this condition depends on the cause. If your condition is caused by a bacterial infection, oral antibiotic medicine may be prescribed. If the bacterial infection has spread to your blood, you may need to receive IV antibiotics. Nonbacterial epididymitis is treated with home care that includes bed rest and elevation of the scrotum. °Surgery may be needed to treat: °· Bacterial epididymitis that causes pus to build up in the scrotum (abscess). °· Chronic epididymitis that has not responded to other treatments. °HOME CARE INSTRUCTIONS °Medicines  °· Take over-the-counter and prescription medicines only as told by your health care provider.   °· If you were prescribed an antibiotic medicine, take it as told by your health care provider. Do not stop taking the antibiotic even if your condition improves. °Sexual Activity  °· If your epididymitis was caused by an STD, avoid sexual activity until your treatment is complete. °· Inform your sexual partner or partners if you test positive for an STD. They may need to be treated. Do not engage in sexual activity with your partner or partners until their treatment is completed. °General Instructions  °· Return to your normal activities as told   by your health care provider. Ask your health care provider what activities are safe for you. °· Keep your scrotum elevated and supported while resting. Ask your health care provider if you should wear a  scrotal support, such as a jockstrap. Wear it as told by your health care provider. °· If directed, apply ice to the affected area:   °¨ Put ice in a plastic bag. °¨ Place a towel between your skin and the bag. °¨ Leave the ice on for 20 minutes, 2-3 times per day. °· Try taking a sitz bath to help with discomfort. This is a warm water bath that is taken while you are sitting down. The water should only come up to your hips and should cover your buttocks. Do this 3-4 times per day or as told by your health care provider. °· Keep all follow-up visits as told by your health care provider. This is important. °SEEK MEDICAL CARE IF:  °· You have a fever.   °· Your pain medicine is not helping.   °· Your pain is getting worse.   °· Your symptoms do not improve within three days. °  °This information is not intended to replace advice given to you by your health care provider. Make sure you discuss any questions you have with your health care provider. °  °Document Released: 05/30/2000 Document Revised: 02/21/2015 Document Reviewed: 10/18/2014 °Elsevier Interactive Patient Education ©2016 Elsevier Inc. ° °

## 2015-04-20 NOTE — ED Provider Notes (Signed)
CSN: [id number]     Arrival date & time 04/20/15  1707 History   First MD Initiated Contact with Patient 04/20/15 1804     Chief Complaint  Patient presents with  . Exposure to STD   (Consider location/radiation/quality/duration/timing/severity/associated sxs/prior Treatment) Patient is a 29 y.o. male presenting with STD exposure. The history is provided by the patient.  Exposure to STD This is a new problem. The current episode started more than 2 days ago. The problem occurs constantly. The problem has not changed since onset.Nothing aggravates the symptoms.    Past Medical History  Diagnosis Date  . Anxiety    Past Surgical History  Procedure Laterality Date  . Laparoscopic appendectomy N/A 02/15/2013    Procedure: APPENDECTOMY LAPAROSCOPIC;  Surgeon: Velora Hecklerodd M Gerkin, MD;  Location: [facility];  Service: General;  Laterality: N/A;  . Appendectomy     History reviewed. No pertinent family history. Social History  Substance Use Topics  . Smoking status: Former Smoker -- 1.00 packs/day    Types: Cigarettes  . Smokeless tobacco: Former NeurosurgeonUser    Quit date: 12/09/2010  . Alcohol Use: No     Comment: rare    Review of Systems  Constitutional: Negative.   HENT: Negative.   Eyes: Negative.   Respiratory: Negative.   Cardiovascular: Negative.   Gastrointestinal: Negative.   Endocrine: Negative.   Genitourinary: Positive for discharge, scrotal swelling, penile pain and testicular pain.  Musculoskeletal: Negative.   Skin: Negative.   Allergic/Immunologic: Negative.   Neurological: Negative.   Hematological: Negative.   Psychiatric/Behavioral: Negative.     Allergies  Lactose intolerance (gi)  Home Medications   Prior to Admission medications   Not on File   Meds Ordered and Administered this Visit  Medications - No data to display  BP 116/59 mmHg  Pulse 61  Temp(Src) 98.2 F (36.8 C) (Oral)  Resp 16  SpO2 100% No data found.   Physical Exam  Constitutional: He  appears well-developed and well-nourished.  HENT:  Head: Normocephalic and atraumatic.  Right Ear: External ear normal.  Left Ear: External ear normal.  Mouth/Throat: Oropharynx is clear and moist.  Eyes: Conjunctivae and EOM are normal. Pupils are equal, round, and reactive to light.  Neck: Normal range of motion. Neck supple.  Cardiovascular: Normal rate, regular rhythm and normal heart sounds.   Pulmonary/Chest: Effort normal and breath sounds normal.  Abdominal: Soft. Bowel sounds are normal.  Genitourinary: Penile tenderness present.  Right testicle tender and swollen     ED Course  Procedures (including critical care time)  Labs Review Labs Reviewed - No data to display  Imaging Review No results found.   Visual Acuity Review  Right Eye Distance:   Left Eye Distance:   Bilateral Distance:    Right Eye Near:   Left Eye Near:    Bilateral Near:         MDM  Epididymitis STD exposure Urethral DC  Rocephin 250mg  IM Azithromax 500mg  2 po now Cipro 500mg  one po bid x 2 weeks.  Follow up with PCP if no improvement.  Anselm PancoastWilliam J Oxford FNP    Deatra CanterWilliam J Oxford, FNP 04/20/15 [phone]

## 2015-10-02 ENCOUNTER — Ambulatory Visit: Payer: Self-pay | Admitting: Family Medicine

## 2015-10-18 ENCOUNTER — Other Ambulatory Visit: Payer: Self-pay | Admitting: Family Medicine

## 2015-10-18 ENCOUNTER — Encounter: Payer: Self-pay | Admitting: Emergency Medicine

## 2015-10-18 ENCOUNTER — Emergency Department (INDEPENDENT_AMBULATORY_CARE_PROVIDER_SITE_OTHER)
Admission: EM | Admit: 2015-10-18 | Discharge: 2015-10-18 | Disposition: A | Discharge status: Home / Self Care | Payer: 59 | Source: Home / Self Care

## 2015-10-18 DIAGNOSIS — Z202 Contact with and (suspected) exposure to infections with a predominantly sexual mode of transmission: Secondary | ICD-10-CM

## 2015-10-18 DIAGNOSIS — N451 Epididymitis: Secondary | ICD-10-CM | POA: Diagnosis not present

## 2015-10-18 MED ORDER — CEFTRIAXONE SODIUM 250 MG IJ SOLR: 250.0000 mg | Freq: Once | INTRAMUSCULAR | Status: DC

## 2015-10-18 MED ORDER — CIPROFLOXACIN HCL 500 MG PO TABS: 500.0000 mg | ORAL_TABLET | Freq: Two times a day (BID) | ORAL | Status: DC

## 2015-10-18 NOTE — ED Provider Notes (Signed)
CSN: [id number]     Arrival date & time 10/18/15  2000 History   None    Chief Complaint  Patient presents with  . Penile Discharge      HPI Comments: Patient concerned about possible STD.  He had unprotected sex about one month ago.  He states that he has developed mild discomfort during urination, but denies urethral discharge.  No testicular pain or swelling.  Over the past several days he has noticed a painless "spot" on the head of his penis.  He feels well otherwise.  No fevers, chills, and sweats.  No sore throat. He states that he had negative HIV testing about 3 months ago.  The history is provided by the patient.    Past Medical History  Diagnosis Date  . Anxiety    Past Surgical History  Procedure Laterality Date  . Laparoscopic appendectomy N/A 02/15/2013    Procedure: APPENDECTOMY LAPAROSCOPIC;  Surgeon: Velora Heckler, MD;  Location: [facility];  Service: General;  Laterality: N/A;  . Appendectomy     No family history on file. Social History  Substance Use Topics  . Smoking status: Former Smoker -- 1.00 packs/day    Types: Cigarettes  . Smokeless tobacco: Former Neurosurgeon    Quit date: 12/09/2010  . Alcohol Use: No     Comment: rare    Review of Systems  Constitutional: Negative for fever, chills, diaphoresis, activity change and fatigue.  HENT: Negative.   Eyes: Negative.   Respiratory: Negative.   Cardiovascular: Negative.   Gastrointestinal: Negative.   Genitourinary: Positive for dysuria and genital sores. Negative for urgency, frequency, hematuria, flank pain, discharge, penile swelling, scrotal swelling, difficulty urinating, penile pain and testicular pain.  Musculoskeletal: Negative for myalgias, back pain, joint swelling, arthralgias, neck pain and neck stiffness.  Skin:       Spot on penis    Allergies  Lactose intolerance (gi)  Home Medications   Prior to Admission medications   Medication Sig Start Date End Date Taking? Authorizing Provider    ciprofloxacin (CIPRO) 500 MG tablet Take 1 tablet (500 mg total) by mouth 2 (two) times daily. 10/18/15   Lattie Haw, MD   Meds Ordered and Administered this Visit   Medications  cefTRIAXone (ROCEPHIN) injection 250 mg (not administered)    BP 81/55 mmHg  Pulse 63  Temp(Src) 98.3 F (36.8 C) (Oral)  Ht  (1.88 m)  Wt 190 lb (86.183 kg)  BMI 24.38 kg/m2  SpO2 100% No data found.   Physical Exam  Constitutional: He is oriented to person, place, and time. He appears well-developed and well-nourished. No distress.  HENT:  Head: Normocephalic.  Eyes: Pupils are equal, round, and reactive to light.  Cardiovascular: Normal heart sounds.   Pulmonary/Chest: Breath sounds normal.  Abdominal: There is no tenderness.  Genitourinary: Testes normal.    Right testis shows no swelling and no tenderness. Left testis shows no swelling and no tenderness.  On the dorsal glans is a 5mm diameter non-specific appearing eschar.  No swelling or tenderness to palpation.  Lymphadenopathy:    He has no cervical adenopathy.  Neurological: He is alert and oriented to person, place, and time.  Skin: No rash noted.  Nursing note and vitals reviewed.   ED Course  Procedures none    Labs Reviewed  RPR  GC/CHLAMYDIA PROBE AMP, URINE     MDM   1. Exposure to STD   2. Epididymitis, right    GC/chlamydia, RPR,  and HSV 1 & 2 antibodies pending.  Patient declines HIV testing Patient declines treatment for GC/chlamydia at this time; will await test results Cipro 500mg  one po bid x 2 weeks.  Follow up with PCP if no improvement.    Lattie HawStephen A Jahzaria Vary, MD 10/24/15 [phone]

## 2015-10-18 NOTE — ED Notes (Signed)
Unprotected sex about a month ago, penile discharge and a "spot" on his penis, denies polyuria, dysuria

## 2015-10-18 NOTE — Discharge Instructions (Signed)
Increase fluid intake.  May take Ibuprofen 200mg , 4 tabs every 8 hours with food.    Sexually Transmitted Disease A sexually transmitted disease (STD) is a disease or infection that may be passed (transmitted) from person to person, usually during sexual activity. This may happen by way of saliva, semen, blood, vaginal mucus, or urine. Common STDs include:  Gonorrhea.  Chlamydia.  Syphilis.  HIV and AIDS.  Genital herpes.  Hepatitis B and C.  Trichomonas.  Human papillomavirus (HPV).  Pubic lice.  Scabies.  Mites.  Bacterial vaginosis. WHAT ARE CAUSES OF STDs? An STD may be caused by bacteria, a virus, or parasites. STDs are often transmitted during sexual activity if one person is infected. However, they may also be transmitted through nonsexual means. STDs may be transmitted after:   Sexual intercourse with an infected person.  Sharing sex toys with an infected person.  Sharing needles with an infected person or using unclean piercing or tattoo needles.  Having intimate contact with the genitals, mouth, or rectal areas of an infected person.  Exposure to infected fluids during birth. WHAT ARE THE SIGNS AND SYMPTOMS OF STDs? Different STDs have different symptoms. Some people may not have any symptoms. If symptoms are present, they may include:  Painful or bloody urination.  Pain in the pelvis, abdomen, vagina, anus, throat, or eyes.  A skin rash, itching, or irritation.  Growths, ulcerations, blisters, or sores in the genital and anal areas.  Abnormal vaginal discharge with or without bad odor.  Penile discharge in men.  Fever.  Pain or bleeding during sexual intercourse.  Swollen glands in the groin area.  Yellow skin and eyes (jaundice). This is seen with hepatitis.  Swollen testicles.  Infertility.  Sores and blisters in the mouth. HOW ARE STDs DIAGNOSED? To make a diagnosis, your health care provider may:  Take a medical  history.  Perform a physical exam.  Take a sample of any discharge to examine.  Swab the throat, cervix, opening to the penis, rectum, or vagina for testing.  Test a sample of your first morning urine.  Perform blood tests.  Perform a Pap test, if this applies.  Perform a colposcopy.  Perform a laparoscopy. HOW ARE STDs TREATED? Treatment depends on the STD. Some STDs may be treated but not cured.  Chlamydia, gonorrhea, trichomonas, and syphilis can be cured with antibiotic medicine.  Genital herpes, hepatitis, and HIV can be treated, but not cured, with prescribed medicines. The medicines lessen symptoms.  Genital warts from HPV can be treated with medicine or by freezing, burning (electrocautery), or surgery. Warts may come back.  HPV cannot be cured with medicine or surgery. However, abnormal areas may be removed from the cervix, vagina, or vulva.  If your diagnosis is confirmed, your recent sexual partners need treatment. This is true even if they are symptom-free or have a negative culture or evaluation. They should not have sex until their health care providers say it is okay.  Your health care provider may test you for infection again 3 months after treatment. HOW CAN I REDUCE MY RISK OF GETTING AN STD? Take these steps to reduce your risk of getting an STD:  Use latex condoms, dental dams, and water-soluble lubricants during sexual activity. Do not use petroleum jelly or oils.  Avoid having multiple sex partners.  Do not have sex with someone who has other sex partners  Do not have sex with anyone you do not know or who is at high risk  for an STD.  Avoid risky sex practices that can break your skin.  Do not have sex if you have open sores on your mouth or skin.  Avoid drinking too much alcohol or taking illegal drugs. Alcohol and drugs can affect your judgment and put you in a vulnerable position.  Avoid engaging in oral and anal sex acts.  Get vaccinated for  HPV and hepatitis. If you have not received these vaccines in the past, talk to your health care provider about whether one or both might be right for you.  If you are at risk of being infected with HIV, it is recommended that you take a prescription medicine daily to prevent HIV infection. This is called pre-exposure prophylaxis (PrEP). You are considered at risk if:  You are a man who has sex with other men (MSM).  You are a heterosexual man or woman and are sexually active with more than one partner.  You take drugs by injection.  You are sexually active with a partner who has HIV.  Talk with your health care provider about whether you are at high risk of being infected with HIV. If you choose to begin PrEP, you should first be tested for HIV. You should then be tested every 3 months for as long as you are taking PrEP. WHAT SHOULD I DO IF I THINK I HAVE AN STD?  See your health care provider.  Tell your sexual partner(s). They should be tested and treated for any STDs.  Do not have sex until your health care provider says it is okay. WHEN SHOULD I GET IMMEDIATE MEDICAL CARE? Contact your health care provider right away if:   You have severe abdominal pain.  You are a man and notice swelling or pain in your testicles.  You are a woman and notice swelling or pain in your vagina.   This information is not intended to replace advice given to you by your health care provider. Make sure you discuss any questions you have with your health care provider.   Document Released: 08/23/2002 Document Revised: 06/23/2014 Document Reviewed: 12/21/2012 Elsevier Interactive Patient Education [employer]2016 Elsevier Inc.

## 2015-10-20 ENCOUNTER — Telehealth: Payer: Self-pay | Admitting: Emergency Medicine

## 2015-10-20 LAB — GC/CHLAMYDIA PROBE AMP
CT Probe RNA: NOT DETECTED
GC PROBE AMP APTIMA: NOT DETECTED

## 2015-10-23 LAB — RPR

## 2015-10-25 LAB — HSV(HERPES SMPLX)ABS-I+II(IGG+IGM)-BLD
HERPES SIMPLEX VRS I-IGM AB (EIA): 0.35 {index}
HSV 1 Glycoprotein G Ab, IgG: 21.1 Index — ABNORMAL HIGH (ref <0.90)

## 2015-10-26 ENCOUNTER — Telehealth: Payer: Self-pay | Admitting: *Deleted

## 2015-11-08 ENCOUNTER — Other Ambulatory Visit: Payer: Self-pay | Admitting: Nurse Practitioner

## 2015-11-08 DIAGNOSIS — N4340 Spermatocele of epididymis, unspecified: Secondary | ICD-10-CM

## 2015-11-09 ENCOUNTER — Ambulatory Visit
Admission: RE | Admit: 2015-11-09 | Discharge: 2015-11-09 | Disposition: A | Discharge status: Home / Self Care | Payer: 59 | Source: Ambulatory Visit | Attending: Nurse Practitioner | Admitting: Nurse Practitioner

## 2015-11-09 DIAGNOSIS — N4340 Spermatocele of epididymis, unspecified: Secondary | ICD-10-CM

## 2022-12-07 DIAGNOSIS — R001 Bradycardia, unspecified: Secondary | ICD-10-CM | POA: Diagnosis not present

## 2022-12-07 DIAGNOSIS — R45851 Suicidal ideations: Secondary | ICD-10-CM | POA: Diagnosis not present

## 2022-12-07 DIAGNOSIS — G4700 Insomnia, unspecified: Secondary | ICD-10-CM | POA: Insufficient documentation

## 2022-12-07 DIAGNOSIS — Z87891 Personal history of nicotine dependence: Secondary | ICD-10-CM | POA: Diagnosis not present

## 2022-12-07 DIAGNOSIS — E876 Hypokalemia: Secondary | ICD-10-CM | POA: Insufficient documentation

## 2022-12-07 DIAGNOSIS — F332 Major depressive disorder, recurrent severe without psychotic features: Secondary | ICD-10-CM | POA: Insufficient documentation

## 2022-12-07 DIAGNOSIS — I498 Other specified cardiac arrhythmias: Secondary | ICD-10-CM | POA: Diagnosis not present

## 2022-12-08 ENCOUNTER — Inpatient Hospital Stay
Admission: RE | Admit: 2022-12-08 | Discharge: 2022-12-10 | DRG: 881 | Disposition: A | Discharge status: Home / Self Care | Payer: Medicaid Other | Source: Intra-hospital | Attending: Psychiatry | Admitting: Psychiatry

## 2022-12-08 ENCOUNTER — Emergency Department (HOSPITAL_COMMUNITY)
Admission: EM | Admit: 2022-12-08 | Discharge: 2022-12-08 | Disposition: A | Discharge status: Other Acute Inpatient Hospital | Payer: Medicaid Other | Attending: Emergency Medicine | Admitting: Emergency Medicine

## 2022-12-08 ENCOUNTER — Other Ambulatory Visit: Payer: Self-pay

## 2022-12-08 ENCOUNTER — Encounter (HOSPITAL_COMMUNITY): Payer: Self-pay

## 2022-12-08 ENCOUNTER — Encounter: Payer: Self-pay | Admitting: Nurse Practitioner

## 2022-12-08 DIAGNOSIS — F329 Major depressive disorder, single episode, unspecified: Principal | ICD-10-CM | POA: Diagnosis present

## 2022-12-08 DIAGNOSIS — R45851 Suicidal ideations: Secondary | ICD-10-CM | POA: Diagnosis present

## 2022-12-08 DIAGNOSIS — Z5986 Financial insecurity: Secondary | ICD-10-CM | POA: Diagnosis not present

## 2022-12-08 DIAGNOSIS — Z5941 Food insecurity: Secondary | ICD-10-CM | POA: Diagnosis not present

## 2022-12-08 DIAGNOSIS — Z818 Family history of other mental and behavioral disorders: Secondary | ICD-10-CM

## 2022-12-08 DIAGNOSIS — E739 Lactose intolerance, unspecified: Secondary | ICD-10-CM | POA: Diagnosis present

## 2022-12-08 DIAGNOSIS — F332 Major depressive disorder, recurrent severe without psychotic features: Secondary | ICD-10-CM | POA: Diagnosis not present

## 2022-12-08 DIAGNOSIS — F29 Unspecified psychosis not due to a substance or known physiological condition: Secondary | ICD-10-CM

## 2022-12-08 DIAGNOSIS — F419 Anxiety disorder, unspecified: Secondary | ICD-10-CM | POA: Diagnosis present

## 2022-12-08 DIAGNOSIS — Z79899 Other long term (current) drug therapy: Secondary | ICD-10-CM

## 2022-12-08 DIAGNOSIS — Z87891 Personal history of nicotine dependence: Secondary | ICD-10-CM

## 2022-12-08 HISTORY — DX: Depression, unspecified: F32.A

## 2022-12-08 LAB — CBC WITH DIFFERENTIAL/PLATELET
Abs Immature Granulocytes: 0.03 10*3/uL (ref 0.00–0.07)
Basophils Absolute: 0.1 10*3/uL (ref 0.0–0.1)
Basophils Relative: 1 %
Eosinophils Absolute: 0 10*3/uL (ref 0.0–0.5)
Eosinophils Relative: 0 %
HCT: 43.3 % (ref 39.0–52.0)
Hemoglobin: 14.4 g/dL (ref 13.0–17.0)
Immature Granulocytes: 0 %
Lymphocytes Relative: 27 %
Lymphs Abs: 2.4 10*3/uL (ref 0.7–4.0)
MCH: 31.9 pg (ref 26.0–34.0)
MCHC: 33.3 g/dL (ref 30.0–36.0)
MCV: 96 fL (ref 80.0–100.0)
Monocytes Absolute: 0.5 10*3/uL (ref 0.1–1.0)
Monocytes Relative: 6 %
Neutro Abs: 5.7 10*3/uL (ref 1.7–7.7)
Neutrophils Relative %: 66 %
Platelets: 262 10*3/uL (ref 150–400)
RBC: 4.51 MIL/uL (ref 4.22–5.81)
RDW: 11.1 % — ABNORMAL LOW (ref 11.5–15.5)
WBC: 8.8 10*3/uL (ref 4.0–10.5)
nRBC: 0 % (ref 0.0–0.2)

## 2022-12-08 LAB — COMPREHENSIVE METABOLIC PANEL
ALT: 23 U/L (ref 0–44)
AST: 26 U/L (ref 15–41)
Albumin: 4.6 g/dL (ref 3.5–5.0)
Alkaline Phosphatase: 46 U/L (ref 38–126)
Anion gap: 10 (ref 5–15)
BUN: 14 mg/dL (ref 6–20)
CO2: 21 mmol/L — ABNORMAL LOW (ref 22–32)
Calcium: 9.3 mg/dL (ref 8.9–10.3)
Chloride: 106 mmol/L (ref 98–111)
Creatinine, Ser: 1.18 mg/dL (ref 0.61–1.24)
GFR, Estimated: >60 mL/min (ref >60)
Glucose, Bld: 104 mg/dL — ABNORMAL HIGH (ref 70–99)
Potassium: 3.4 mmol/L — ABNORMAL LOW (ref 3.5–5.1)
Sodium: 137 mmol/L (ref 135–145)
Total Bilirubin: 1.1 mg/dL (ref 0.3–1.2)
Total Protein: 7.2 g/dL (ref 6.5–8.1)

## 2022-12-08 LAB — RAPID URINE DRUG SCREEN, HOSP PERFORMED
Amphetamines: NOT DETECTED
Barbiturates: NOT DETECTED
Benzodiazepines: NOT DETECTED
Cocaine: NOT DETECTED
Opiates: NOT DETECTED
Tetrahydrocannabinol: NOT DETECTED

## 2022-12-08 LAB — ETHANOL: Alcohol, Ethyl (B): <10 mg/dL (ref <10)

## 2022-12-08 MED ORDER — LORAZEPAM 2 MG/ML IJ SOLN: 2.0000 mg | Freq: Three times a day (TID) | INTRAMUSCULAR | Status: DC | PRN

## 2022-12-08 MED ORDER — BUPROPION HCL ER (XL) 150 MG PO TB24
150.0000 mg | ORAL_TABLET | Freq: Every day | ORAL | Status: DC
  Administered 2022-12-09: 150 mg via ORAL
  Filled 2022-12-08: qty 1

## 2022-12-08 MED ORDER — POTASSIUM CHLORIDE CRYS ER 20 MEQ PO TBCR
40.0000 meq | EXTENDED_RELEASE_TABLET | Freq: Once | ORAL | Status: AC
  Administered 2022-12-08: 40 meq via ORAL
  Filled 2022-12-08: qty 2

## 2022-12-08 MED ORDER — ALUM & MAG HYDROXIDE-SIMETH 200-200-20 MG/5ML PO SUSP: 30.0000 mL | ORAL | Status: DC | PRN

## 2022-12-08 MED ORDER — HYDROXYZINE HCL 25 MG PO TABS: 25.0000 mg | ORAL_TABLET | Freq: Three times a day (TID) | ORAL | Status: DC | PRN

## 2022-12-08 MED ORDER — MAGNESIUM HYDROXIDE 400 MG/5ML PO SUSP: 30.0000 mL | Freq: Every day | ORAL | Status: DC | PRN

## 2022-12-08 MED ORDER — HALOPERIDOL 5 MG PO TABS: 5.0000 mg | ORAL_TABLET | Freq: Three times a day (TID) | ORAL | Status: DC | PRN

## 2022-12-08 MED ORDER — TRAZODONE HCL 50 MG PO TABS: 50.0000 mg | ORAL_TABLET | Freq: Every evening | ORAL | Status: DC | PRN

## 2022-12-08 MED ORDER — ACETAMINOPHEN 325 MG PO TABS: 650.0000 mg | ORAL_TABLET | Freq: Four times a day (QID) | ORAL | Status: DC | PRN

## 2022-12-08 MED ORDER — BUPROPION HCL ER (XL) 150 MG PO TB24
150.0000 mg | ORAL_TABLET | Freq: Every day | ORAL | Status: DC
  Administered 2022-12-08: 150 mg via ORAL
  Filled 2022-12-08: qty 1

## 2022-12-08 MED ORDER — LORAZEPAM 2 MG PO TABS: 2.0000 mg | ORAL_TABLET | Freq: Three times a day (TID) | ORAL | Status: DC | PRN

## 2022-12-08 MED ORDER — HALOPERIDOL LACTATE 5 MG/ML IJ SOLN: 5.0000 mg | Freq: Three times a day (TID) | INTRAMUSCULAR | Status: DC | PRN

## 2022-12-08 MED ORDER — TRAZODONE HCL 50 MG PO TABS
50.0000 mg | ORAL_TABLET | Freq: Every evening | ORAL | Status: DC | PRN
  Administered 2022-12-08: 50 mg via ORAL
  Filled 2022-12-08 (×2): qty 1

## 2022-12-08 NOTE — ED Triage Notes (Signed)
Pt said that he knows that there is something wrong with him and he wants to talk to a counselor. York Spaniel that his current situation is affecting his eating, sleeping, and how he raises his child. Not suicidal or homicidal but wants help.

## 2022-12-08 NOTE — ED Provider Notes (Signed)
[place] EMERGENCY DEPARTMENT AT [facility] Provider Note   CSN: [id number] Arrival date & time: 12/07/22  2342     History  Chief Complaint  Patient presents with   Psychiatric Evaluation    Roel Douthat is a 37 y.o. male.  HPI   37 year old male presents emergency department with complaints of "something is wrong with me."  Patient states that he has had difficulty sleeping over the past 3 to 4 days sleeping minimally.  Patient states the only sleep that has gotten is while waiting to be seen by provider in the emergency department.  Reports some increased life stressors will not go into detail about what has been happening in his life or at home.  Reports racing thoughts, easily distractibility, suicidal thoughts.  Denies any suicidal plan or homicidal ideation, auditory/visual hallucinations.  Patient states he has not had symptoms like this before.  Denies any drug/substance use.  Denies any chest pain, shortness of breath, abdominal pain, nausea, vomiting, urinary symptoms, change in bowel habits, fever, chills, night sweats.  No significant pertinent past medical history. Home Medications Prior to Admission medications   Medication Sig Start Date End Date Taking? Authorizing Provider  ciprofloxacin (CIPRO) 500 MG tablet Take 1 tablet (500 mg total) by mouth 2 (two) times daily. 10/18/15   Lattie Haw, MD      Allergies    Lactose intolerance (gi)    Review of Systems   Review of Systems  All other systems reviewed and are negative.   Physical Exam Updated Vital Signs BP 107/74   Pulse 67   Temp 98.5 F (36.9 C)   Resp 18   Ht 6\' 2"  (1.88 m)   Wt 86.2 kg   SpO2 100%   BMI 24.39 kg/m  Physical Exam Vitals and nursing note reviewed.  Constitutional:      General: He is not in acute distress.    Appearance: He is well-developed.  HENT:     Head: Normocephalic and atraumatic.  Eyes:     Conjunctiva/sclera: Conjunctivae normal.  Cardiovascular:      Rate and Rhythm: Normal rate and regular rhythm.     Heart sounds: No murmur heard. Pulmonary:     Effort: Pulmonary effort is normal. No respiratory distress.     Breath sounds: Normal breath sounds. No wheezing, rhonchi or rales.  Abdominal:     Palpations: Abdomen is soft.     Tenderness: There is no abdominal tenderness. There is no guarding.  Musculoskeletal:        General: No swelling.     Cervical back: Neck supple.     Right lower leg: No edema.     Left lower leg: No edema.  Skin:    General: Skin is warm and dry.     Capillary Refill: Capillary refill takes less than 2 seconds.  Neurological:     Mental Status: He is alert.  Psychiatric:     Comments: Patient with flat affect, speaking barely above whisper initially.  Mood seems depressed.  Patient responds appropriately to question with coherent speech/responses.     ED Results / Procedures / Treatments   Labs (all labs ordered are listed, but only abnormal results are displayed) Labs Reviewed  COMPREHENSIVE METABOLIC PANEL - Abnormal; Notable for the following components:      Result Value   Potassium 3.4 (*)    CO2 21 (*)    Glucose, Bld 104 (*)    All other components within  normal limits  CBC WITH DIFFERENTIAL/PLATELET - Abnormal; Notable for the following components:   RDW 11.1 (*)    All other components within normal limits  ETHANOL  RAPID URINE DRUG SCREEN, HOSP PERFORMED    EKG None  Radiology No results found.  Procedures Procedures    Medications Ordered in ED Medications - No data to display  ED Course/ Medical Decision Making/ A&P                             Medical Decision Making Amount and/or Complexity of Data Reviewed Labs: ordered.   This patient presents to the ED for concern of suicidal thoughts, racing thoughts, easily distractibility, difficulty concentrating, this involves an extensive number of treatment options, and is a complaint that carries with it a high risk  of complications and morbidity.  The differential diagnosis includes acute psychosis, drug intoxication/withdrawal, depression, anxiety   Co morbidities that complicate the patient evaluation  See HPI   Additional history obtained:  Additional history obtained from EMR External records from outside source obtained and reviewed including hospital records   Lab Tests:  I Ordered, and personally interpreted labs.  The pertinent results include: No leukocytosis noted.  No evidence of anemia.  Platelets within range.  Mild hypokalemia and decrease in bicarb at 3.4 and 21 respectively but otherwise, electrolytes within normal limits.  No transaminitis.  No renal dysfunction.  UDS within normal limits.  Ethanol within normal limits.   Imaging Studies ordered:  N/a   Cardiac Monitoring: / EKG:  The patient was maintained on a cardiac monitor.  I personally viewed and interpreted the cardiac monitored which showed an underlying rhythm of: Sinus rhythm   Consultations Obtained:  I requested consultation TTS upon medical clearance  Problem List / ED Course / Critical interventions / Medication management  Suicidal thoughts, racing thoughts, insomnia Reevaluation of the patient showed that the patient stayed the same I have reviewed the patients home medicines and have made adjustments as needed   Social Determinants of Health:  Former cigarette use.  Denies illicit drug use.   Test / Admission - Considered:  Suicidal thoughts, racing thoughts, insomnia Vitals signs  within normal range and stable throughout visit. Laboratory studies significant for: See above 37 year old male presents emergency department with complaints of suicidal thoughts, racing thoughts, insomnia, easily distractibility.  Patient seems to be with depressed mood but withholds major details regarding reasons as to what exactly has affected his mood/affect.  Patient did express suicidal thoughts without  obvious ideation/plan.  Will place TTS consultation upon medical clearance given concern for suicidal thoughts and aspects of possible psychosis.  Patient without any acute complaint otherwise without obvious concerning abnormality on laboratory studies or physical exam; patient cleared medically for TTS consultation.  Treatment plan discussed at length with patient and he acknowledged understanding was agreeable to said plan.  Patient stable upon consultation.        Final Clinical Impression(s) / ED Diagnoses Final diagnoses:  None    Rx / DC Orders ED Discharge Orders     None         Peter Garter, [place] 12/08/22 1224    Gerhard Munch, MD 12/08/22 [phone]

## 2022-12-08 NOTE — Tx Team (Signed)
Initial Treatment Plan 12/08/2022 11:51 PM Anis Degidio BMW:[id number]    PATIENT STRESSORS: Financial difficulties   Marital or family conflict     PATIENT STRENGTHS: Manufacturing systems engineer  Motivation for treatment/growth    PATIENT IDENTIFIED PROBLEMS: Depression  Relationship issues  Financial Insomnia                 DISCHARGE CRITERIA:  Improved stabilization in mood, thinking, and/or behavior  PRELIMINARY DISCHARGE PLAN: Outpatient therapy  PATIENT/FAMILY INVOLVEMENT: This treatment plan has been presented to and reviewed with the patient, Javier Garza, and/or family member, .  The patient and family have been given the opportunity to ask questions and make suggestions.  Javier Media, RN 12/08/2022, 11:51 PM

## 2022-12-08 NOTE — ED Notes (Signed)
Coleman NP at bedside. 

## 2022-12-08 NOTE — ED Notes (Addendum)
Found pt belongings behind nurse's station in triage. See pt belongings documentation for what this RN noted in belongings at time of locating them.

## 2022-12-08 NOTE — Progress Notes (Signed)
Patient presents with sad, flat affect. Denies Si, HI, but endorses depression. Patient having relationship issues and has not been sleeping. Recently asked his partner to move out. Having racing thoughts. Was previously on medication but weaned himself off. Oriented patient to room and unit. Fluid and nutrition offered. Skin and contraband check completed. No skin issues noted, no contraband found.  Prn given for sleep.  Encouragement and support provided. Safety checks maintained. Medications given as prescribed. Pt receptive and remains safe on unit with q 15 min checks.Marland Kitchen

## 2022-12-08 NOTE — Progress Notes (Signed)
Pt was accepted to [facility] TODAY 12/08/2022, pending signed voluntary consent faxed to [phone]. Bed assignment: 306  Pt meets inpatient criteria per Eligha Bridegroom, NP  Attending Physician will be Lewanda Rife, MD  Report can be called to: [phone]  Pt can arrive after 2000  Care Team Notified: [facility] Malva Limes, RN, Eligha Bridegroom, NP, and Horton Marshall, RN  [place], [place]  12/08/2022 2:41 PM
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
## 2022-12-09 DIAGNOSIS — F332 Major depressive disorder, recurrent severe without psychotic features: Secondary | ICD-10-CM | POA: Diagnosis not present

## 2022-12-09 MED ORDER — MIRTAZAPINE 15 MG PO TABS
15.0000 mg | ORAL_TABLET | Freq: Every day | ORAL | Status: DC
  Administered 2022-12-09: 15 mg via ORAL
  Filled 2022-12-09: qty 1

## 2022-12-09 NOTE — Plan of Care (Signed)
Problem: Education: Goal: Knowledge of General Education information will improve Description: Including pain rating scale, medication(s)/side effects and non-pharmacologic comfort measures Outcome: Not Progressing   Problem: Health Behavior/Discharge Planning: Goal: Ability to manage health-related needs will improve Outcome: Not Progressing   Problem: Clinical Measurements: Goal: Ability to maintain clinical measurements within normal limits will improve Outcome: Not Progressing Goal: Will remain free from infection Outcome: Not Progressing Goal: Diagnostic test results will improve Outcome: Not Progressing Goal: Respiratory complications will improve Outcome: Not Progressing Goal: Cardiovascular complication will be avoided Outcome: Not Progressing   Problem: Activity: Goal: Risk for activity intolerance will decrease Outcome: Not Progressing   Problem: Nutrition: Goal: Adequate nutrition will be maintained Outcome: Not Progressing   Problem: Coping: Goal: Level of anxiety will decrease Outcome: Not Progressing   Problem: Elimination: Goal: Will not experience complications related to bowel motility Outcome: Not Progressing Goal: Will not experience complications related to urinary retention Outcome: Not Progressing   Problem: Pain Managment: Goal: General experience of comfort will improve Outcome: Not Progressing   Problem: Safety: Goal: Ability to remain free from injury will improve Outcome: Not Progressing   Problem: Skin Integrity: Goal: Risk for impaired skin integrity will decrease Outcome: Not Progressing   Problem: Education: Goal: Knowledge of [place] [facility] Education information/materials will improve Outcome: Not Progressing Goal: Emotional status will improve Outcome: Not Progressing Goal: Mental status will improve Outcome: Not Progressing Goal: Verbalization of understanding the information provided will improve Outcome: Not  Progressing   Problem: Activity: Goal: Interest or engagement in activities will improve Outcome: Not Progressing Goal: Sleeping patterns will improve Outcome: Not Progressing   Problem: Coping: Goal: Ability to verbalize frustrations and anger appropriately will improve Outcome: Not Progressing Goal: Ability to demonstrate self-control will improve Outcome: Not Progressing   Problem: Health Behavior/Discharge Planning: Goal: Identification of resources available to assist in meeting health care needs will improve Outcome: Not Progressing Goal: Compliance with treatment plan for underlying cause of condition will improve Outcome: Not Progressing   Problem: Physical Regulation: Goal: Ability to maintain clinical measurements within normal limits will improve Outcome: Not Progressing   Problem: Safety: Goal: Periods of time without injury will increase Outcome: Not Progressing   Problem: Education: Goal: Utilization of techniques to improve thought processes will improve Outcome: Not Progressing Goal: Knowledge of the prescribed therapeutic regimen will improve Outcome: Not Progressing   Problem: Activity: Goal: Interest or engagement in leisure activities will improve Outcome: Not Progressing Goal: Imbalance in normal sleep/wake cycle will improve Outcome: Not Progressing   Problem: Coping: Goal: Coping ability will improve Outcome: Not Progressing Goal: Will verbalize feelings Outcome: Not Progressing   Problem: Health Behavior/Discharge Planning: Goal: Ability to make decisions will improve Outcome: Not Progressing Goal: Compliance with therapeutic regimen will improve Outcome: Not Progressing   Problem: Role Relationship: Goal: Will demonstrate positive changes in social behaviors and relationships Outcome: Not Progressing   Problem: Safety: Goal: Ability to disclose and discuss suicidal ideas will improve Outcome: Not Progressing Goal: Ability to identify  and utilize support systems that promote safety will improve Outcome: Not Progressing   Problem: Self-Concept: Goal: Will verbalize positive feelings about self Outcome: Not Progressing Goal: Level of anxiety will decrease Outcome: Not Progressing   Problem: Education: Goal: Ability to make informed decisions regarding treatment will improve Outcome: Not Progressing   Problem: Coping: Goal: Coping ability will improve Outcome: Not Progressing   Problem: Health Behavior/Discharge Planning: Goal: Identification of resources available to assist in meeting health care   needs will improve Outcome: Not Progressing   Problem: Medication: Goal: Compliance with prescribed medication regimen will improve Outcome: Not Progressing   Problem: Self-Concept: Goal: Ability to disclose and discuss suicidal ideas will improve Outcome: Not Progressing Goal: Will verbalize positive feelings about self Outcome: Not Progressing   

## 2022-12-09 NOTE — Group Note (Signed)
Date:  12/09/2022 Time:  3:28 PM  Group Topic/Focus:  Community Group:   The goal of this group is to inform the patients of the rules and policies of the unit, who to notify and talk to when there is a concern, remind them of the availability of the staff when there is a request or issue.  We also utilize the beach ball which has different ways to express their feelings and moods they are currently experiencing.   Participation Level:  Did Not Attend   Javier Garza 12/09/2022, 3:28 PM

## 2022-12-09 NOTE — Progress Notes (Signed)
D- Patient alert and oriented x 4. Affect flat/depressed mood. Denies SI/ HI/ AVH. Patient denies pain. Patient endorses depression and anxiety. He states he is ready to leave and wants "to leave to go back to work tomorrow".  A- Scheduled medications administered to patient, per MD orders. Support and encouragement provided.  Routine safety checks conducted every 15 minutes without incident. Patient informed to notify staff with problems or concerns and verbalizes understanding. R- No adverse drug reactions noted.  Patient compliant with medications and treatment plan. Patient receptive, calm and cooperative. He is isolative to his room except for meals. Patient contracts for safety and  remains safe on the unit at this time.

## 2022-12-09 NOTE — Group Note (Signed)
Date:  12/09/2022 Time:  9:23 PM  Group Topic/Focus:  Wrap-Up Group:   The focus of this group is to help patients review their daily goal of treatment and discuss progress on daily workbooks.    Participation Level:  Minimal  Participation Quality:  Appropriate  Affect:  Appropriate  Cognitive:  Alert  Insight: Appropriate  Engagement in Group:  None  Modes of Intervention:  Clarification  Additional Comments:     Maglione,Kaedan Richert E 12/09/2022, 9:23 PM  
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
## 2022-12-09 NOTE — Group Note (Signed)
[facility] LCSW Group Therapy Note   Group Date: 12/09/2022 Start Time: 1310 End Time: 1400   Type of Therapy/Topic:  Group Therapy:  Balance in Life  Participation Level:  Did Not Attend   Description of Group:    This group will address the concept of balance and how it feels and looks when one is unbalanced. Patients will be encouraged to process areas in their lives that are out of balance, and identify reasons for remaining unbalanced. Facilitators will guide patients utilizing problem- solving interventions to address and correct the stressor making their life unbalanced. Understanding and applying boundaries will be explored and addressed for obtaining  and maintaining a balanced life. Patients will be encouraged to explore ways to assertively make their unbalanced needs known to significant others in their lives, using other group members and facilitator for support and feedback.  Therapeutic Goals: Patient will identify two or more emotions or situations they have that consume much of in their lives. Patient will identify signs/triggers that life has become out of balance:  Patient will identify two ways to set boundaries in order to achieve balance in their lives:  Patient will demonstrate ability to communicate their needs through discussion and/or role plays  Summary of Patient Progress: X   Therapeutic Modalities:   Cognitive Behavioral Therapy Solution-Focused Therapy Assertiveness Training   Evangelyn Crouse R Tyrika Newman, LCSW 

## 2022-12-09 NOTE — Group Note (Signed)
Recreation Therapy Group Note   Group Topic:General Recreation  Group Date: 12/09/2022 Start Time: 1000 End Time: 1050 Facilitators: Rosina Lowenstein, LRT, CTRS Location: Courtyard  Group Description: Outdoor Recreation. Patients had the option to play basketball, draw with sidewalk chalk, play with a deck of cards while outside in the courtyard getting fresh air and sunlight. LRT played music in the background through a speaker. LRT and pts discussed things that they enjoy doing in their free time outside of the hospital.  Goal Area(s) Addressed: Patient will identify leisure interests.  Patient will practice healthy decision making. Patient will engage in recreation activity.  Affect/Mood: N/A   Participation Level: Did not attend    Clinical Observations/Individualized Feedback: Javier Garza did not attend group.  Plan: Continue to engage patient in RT group sessions 2-3x/week.   Rosina Lowenstein, LRT, CTRS 12/09/2022 11:42 AM

## 2022-12-09 NOTE — [facility] Suicide Risk Assessment (Signed)
[facility] INPATIENT:  Family/Significant Other Suicide Prevention Education  Suicide Prevention Education:  Patient Refusal for Family/Significant Other Suicide Prevention Education: The patient Javier Garza has refused to provide written consent for family/significant other to be provided Family/Significant Other Suicide Prevention Education during admission and/or prior to discharge.  Physician notified.  SPE completed with pt, as pt refused to consent to family contact. SPI pamphlet provided to pt and pt was encouraged to share information with support network, ask questions, and talk about any concerns relating to SPE. Pt denies access to guns/firearms and verbalized understanding of information provided. Mobile Crisis information also provided to pt.   Harden Mo 12/09/2022, 3:27 PM
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
## 2022-12-09 NOTE — BHH Suicide Risk Assessment (Signed)
[facility] Admission Suicide Risk Assessment   Nursing information obtained from:  Patient Demographic factors:  Male, Adolescent or young adult Current Mental Status:  NA Loss Factors:  Loss of significant relationship, Financial problems / change in socioeconomic status Historical Factors:  NA Risk Reduction Factors:  Responsible for children under 38 years of age  Total Time spent with patient: 36 minutes Principal Problem: MDD (major depressive disorder) Diagnosis:  Principal Problem:   MDD (major depressive disorder)  Subjective- Javier Garza is a 37 year old male with multiple depressive episodes, worsening over the past 6 months along with suicidal ideations recently.  He is soft-spoken and mild mannered.  Reports that he has been on Prozac Lexapro and Zoloft but has been off of all psychiatric medications for 15 years due to limited benefit and not tolerating them well.  Over the past couple of months he has tried exercising and taking vitamin D as ways to mitigate depression and anxiety.   Patient reports financial problems as well as continued toxic relationship with his ex partner who is described to be narcissistic.  Patient reports a longstanding history of sleep issues, usually works second and third shift.  He reports a lack of joy in his life and poor concentration.  Reports feeling exhausted with low motivation.  He has lost 20 pounds in the past 8 months.  Denies any history of psychosis or mania.  Contracts for safety at this time.  Continued Clinical Symptoms:  Alcohol Use Disorder Identification Test Final Score (AUDIT): 0 The "Alcohol Use Disorders Identification Test", Guidelines for Use in Primary Care, Second Edition.  World Science writer [facility]). Score between 0-7:  no or low risk or alcohol related problems. Score between 8-15:  moderate risk of alcohol related problems. Score between 16-19:  high risk of alcohol related problems. Score 20 or above:  warrants further  diagnostic evaluation for alcohol dependence and treatment.   CLINICAL FACTORS:   Depression:   Anhedonia   Musculoskeletal: Strength & Muscle Tone: within normal limits Gait & Station: normal Patient leans: Right  Psychiatric Specialty Exam:  Presentation  General Appearance:  Appropriate for Environment  Eye Contact: Good  Speech: Clear and Coherent  Speech Volume: Decreased  Handedness:No data recorded  Mood and Affect  Mood: Depressed; Hopeless  Affect: Flat; Tearful   Thought Process  Thought Processes: Coherent; Goal Directed  Descriptions of Associations:Intact  Orientation:Full (Time, Place and Person)  Thought Content:WDL; Logical  History of Schizophrenia/Schizoaffective disorder:No data recorded Duration of Psychotic Symptoms:No data recorded Hallucinations:Hallucinations: None  Ideas of Reference:None  Suicidal Thoughts:Suicidal Thoughts: Yes, Passive SI Passive Intent and/or Plan: Without Intent; Without Plan  Homicidal Thoughts:Homicidal Thoughts: No   Sensorium  Memory: Immediate Fair; Recent Fair  Judgment: Fair  Insight: Fair   Art therapist  Concentration: Fair  Attention Span: Fair  Recall: Good  Fund of Knowledge: Good  Language: Good   Psychomotor Activity  Psychomotor Activity: Psychomotor Activity: Normal   Assets  Assets: Desire for Improvement; Physical Health; Resilience; Social Support   Sleep  Sleep: Sleep: Fair    Physical Exam: Physical Exam ROS Blood pressure (!) 93/59, pulse (!) 58, temperature 98.2 F (36.8 C), temperature source Oral, resp. rate (!) 21, height 6\' 2"  (1.88 m), weight 82.1 kg, SpO2 100 %. Body mass index is 23.24 kg/m.   COGNITIVE FEATURES THAT CONTRIBUTE TO RISK:  Closed-mindedness    SUICIDE RISK:   Severe:  Frequent, intense, and enduring suicidal ideation, specific plan, no subjective intent, but  some objective markers of intent (i.e., choice of  lethal method), the method is accessible, some limited preparatory behavior, evidence of impaired self-control, severe dysphoria/symptomatology, multiple risk factors present, and few if any protective factors, particularly a lack of social support.  PLAN OF CARE:   Start Remeron 15 mg p.o. nightly.  Risk-benefit side effects alternatives reviewed Discontinue Wellbutrin XL 150 mg Tablets and maintain a therapeutic alliance with patient Labs and tests reviewed   I certify that inpatient services furnished can reasonably be expected to improve the patient's condition.   Reggie Pile, MD 12/09/2022, 11:04 AM

## 2022-12-10 DIAGNOSIS — F332 Major depressive disorder, recurrent severe without psychotic features: Secondary | ICD-10-CM | POA: Diagnosis not present

## 2022-12-10 LAB — POTASSIUM: Potassium: 3.9 mmol/L (ref 3.5–5.1)

## 2022-12-10 LAB — TSH: TSH: 2.823 u[IU]/mL (ref 0.350–4.500)

## 2022-12-10 MED ORDER — MIRTAZAPINE 15 MG PO TABS: 15.0000 mg | ORAL_TABLET | Freq: Every day | ORAL | 0 refills | Status: AC

## 2022-12-10 MED ORDER — BUPROPION HCL ER (XL) 150 MG PO TB24: 150.0000 mg | ORAL_TABLET | Freq: Every day | ORAL | 0 refills | Status: AC

## 2022-12-10 MED ORDER — BUPROPION HCL ER (XL) 150 MG PO TB24
150.0000 mg | ORAL_TABLET | Freq: Every day | ORAL | Status: DC
  Administered 2022-12-10: 150 mg via ORAL
  Filled 2022-12-10: qty 1

## 2022-12-10 NOTE — BHH Suicide Risk Assessment (Signed)
[facility] Discharge Suicide Risk Assessment   Principal Problem: MDD (major depressive disorder) Discharge Diagnoses: Principal Problem:   MDD (major depressive disorder)   Total Time spent with patient: 30 minutes  Musculoskeletal: Strength & Muscle Tone: within normal limits Gait & Station: normal Patient leans: Right  Psychiatric Specialty Exam  Presentation  General Appearance:  Appropriate for Environment  Eye Contact: Good  Speech: Clear and Coherent  Speech Volume: Decreased  Handedness:No data recorded  Mood and Affect  Mood: Depressed; Hopeless  Duration of Depression Symptoms: No data recorded Affect: Flat; Tearful   Thought Process  Thought Processes: Coherent; Goal Directed  Descriptions of Associations:Intact  Orientation:Full (Time, Place and Person)  Thought Content:WDL; Logical  History of Schizophrenia/Schizoaffective disorder:No data recorded Duration of Psychotic Symptoms:No data recorded Hallucinations:No data recorded Ideas of Reference:None  Suicidal Thoughts:No data recorded Homicidal Thoughts:No data recorded  Sensorium  Memory: Immediate Fair; Recent Fair  Judgment: Fair  Insight: Fair   Art therapist  Concentration: Fair  Attention Span: Fair  Recall: Good  Fund of Knowledge: Good  Language: Good   Psychomotor Activity  Psychomotor Activity:No data recorded  Assets  Assets: Desire for Improvement; Physical Health; Resilience; Social Support   Sleep  Sleep:No data recorded  Physical Exam: Physical Exam ROS Blood pressure 105/62, pulse (!) 58, temperature 97.7 F (36.5 C), temperature source Oral, resp. rate (!) 21, height 6\' 2"  (1.88 m), weight 82.1 kg, SpO2 99 %. Body mass index is 23.24 kg/m.  Mental Status Per Nursing Assessment::   On Admission:  NA  Demographic Factors:  Male   Risk Reduction Factors:    Suicide Risk:  Minimal: No identifiable suicidal ideation.  Patients  presenting with no risk factors but with morbid ruminations; may be classified as minimal risk based on the severity of the depressive symptoms    Plan Of Care/Follow-up recommendations:  Activity:  As tolerated.   Reggie Pile, MD 12/10/2022, 8:30 AM

## 2022-12-10 NOTE — BHH Counselor (Signed)
CSW faxed over pt's H&P and discharge note from the physician to [place] [facility] in [place] [phone]) to confirm pt's appointment on July 11th at 2:30 PM.   Reynaldo Minium, MSW, [facility] 12/10/2022 11:50 AM

## 2022-12-10 NOTE — Progress Notes (Signed)
Patient presents with sad, flat affect. Noted in dayroom watching tv with peers. No interaction. Minimal interaction with staff. Denies SI, HI, AVH. Endorses depression. States he told provider that he did not like the way wellbrutrin made him feel. He now reports he thinks that may be the trazodone that had him feeling that way. Patient may want to start taking again. Patient Is isolative to self, medication compliant. Encouragement and support provided. Safety checks maintained. Medications given as prescribed. Pt receptive and remains safe on unit with q 15 min checks.

## 2022-12-10 NOTE — BH IP Treatment Plan (Signed)
Interdisciplinary Treatment and Diagnostic Plan Update  12/10/2022 Time of Session: 9:00AM Javier Garza MRN: [id number]  Principal Diagnosis: MDD (major depressive disorder)  Secondary Diagnoses: Principal Problem:   MDD (major depressive disorder)   Current Medications:  Current Facility-Administered Medications  Medication Dose Route Frequency Provider Last Rate Last Admin   acetaminophen (TYLENOL) tablet 650 mg  650 mg Oral Q6H PRN Eligha Bridegroom, NP       alum & mag hydroxide-simeth (MAALOX/MYLANTA) 200-200-20 MG/5ML suspension 30 mL  30 mL Oral Q4H PRN Eligha Bridegroom, NP       buPROPion (WELLBUTRIN XL) 24 hr tablet 150 mg  150 mg Oral Daily Reggie Pile, MD   150 mg at 12/10/22 1212   haloperidol (HALDOL) tablet 5 mg  5 mg Oral TID PRN Eligha Bridegroom, NP       Or   haloperidol lactate (HALDOL) injection 5 mg  5 mg Intramuscular TID PRN Eligha Bridegroom, NP       hydrOXYzine (ATARAX) tablet 25 mg  25 mg Oral TID PRN Eligha Bridegroom, NP       LORazepam (ATIVAN) tablet 2 mg  2 mg Oral TID PRN Eligha Bridegroom, NP       Or   LORazepam (ATIVAN) injection 2 mg  2 mg Intramuscular TID PRN Eligha Bridegroom, NP       magnesium hydroxide (MILK OF MAGNESIA) suspension 30 mL  30 mL Oral Daily PRN Eligha Bridegroom, NP       mirtazapine (REMERON) tablet 15 mg  15 mg Oral QHS Reggie Pile, MD   15 mg at 12/09/22 2125   PTA Medications: No medications prior to admission.    Patient Stressors: Financial difficulties   Marital or family conflict    Patient Strengths: Printmaker for treatment/growth   Treatment Modalities: Medication Management, Group therapy, Case management,  1 to 1 session with clinician, Psychoeducation, Recreational therapy.   Physician Treatment Plan for Primary Diagnosis: MDD (major depressive disorder) Long Term Goal(s): Improvement in symptoms so as ready for discharge   Short Term Goals: Ability to disclose and discuss suicidal  ideas  Medication Management: Evaluate patient's response, side effects, and tolerance of medication regimen.  Therapeutic Interventions: 1 to 1 sessions, Unit Group sessions and Medication administration.  Evaluation of Outcomes: Not Met  Physician Treatment Plan for Secondary Diagnosis: Principal Problem:   MDD (major depressive disorder)  Long Term Goal(s): Improvement in symptoms so as ready for discharge   Short Term Goals: Ability to disclose and discuss suicidal ideas     Medication Management: Evaluate patient's response, side effects, and tolerance of medication regimen.  Therapeutic Interventions: 1 to 1 sessions, Unit Group sessions and Medication administration.  Evaluation of Outcomes: Not Met   RN Treatment Plan for Primary Diagnosis: MDD (major depressive disorder) Long Term Goal(s): Knowledge of disease and therapeutic regimen to maintain health will improve  Short Term Goals: Ability to verbalize frustration and anger appropriately will improve, Ability to demonstrate self-control, Ability to participate in decision making will improve, Ability to verbalize feelings will improve, Ability to disclose and discuss suicidal ideas, Ability to identify and develop effective coping behaviors will improve, and Compliance with prescribed medications will improve  Medication Management: RN will administer medications as ordered by provider, will assess and evaluate patient's response and provide education to patient for prescribed medication. RN will report any adverse and/or side effects to prescribing provider.  Therapeutic Interventions: 1 on 1 counseling sessions, Psychoeducation, Medication administration, Evaluate responses to  treatment, Monitor vital signs and CBGs as ordered, Perform/monitor CIWA, COWS, AIMS and Fall Risk screenings as ordered, Perform wound care treatments as ordered.  Evaluation of Outcomes: Not Met   LCSW Treatment Plan for Primary Diagnosis: MDD  (major depressive disorder) Long Term Goal(s): Safe transition to appropriate next level of care at discharge, Engage patient in therapeutic group addressing interpersonal concerns.  Short Term Goals: Engage patient in aftercare planning with referrals and resources, Increase social support, Increase ability to appropriately verbalize feelings, Increase emotional regulation, Facilitate acceptance of mental health diagnosis and concerns, and Increase skills for wellness and recovery  Therapeutic Interventions: Assess for all discharge needs, 1 to 1 time with Social worker, Explore available resources and support systems, Assess for adequacy in community support network, Educate family and significant other(s) on suicide prevention, Complete Psychosocial Assessment, Interpersonal group therapy.  Evaluation of Outcomes: Not Met   Progress in Treatment: Attending groups: No. Participating in groups: No. Taking medication as prescribed: Yes. Toleration medication: Yes. Family/Significant other contact made: No, will contact:  once permission is given Patient understands diagnosis: No. Discussing patient identified problems/goals with staff: No. Medical problems stabilized or resolved: Yes. Denies suicidal/homicidal ideation: Yes. Issues/concerns per patient self-inventory: No. Other: none  New problem(s) identified: No, Describe:  none  New Short Term/Long Term Goal(s): detox, elimination of symptoms of psychosis, medication management for mood stabilization; elimination of SI thoughts; development of comprehensive mental wellness/sobriety plan.   Patient Goals:  Patient declined to attend treatment team.  Discharge Plan or Barriers: CSW advocated for patient to continue hospitalization.  Patient felt ready for discharge and physician proceeded with discharge. CSW team scheduled patient for an aftercare appointment and provided resources to address food instability as well as support groups  available for the patient.   Reason for Continuation of Hospitalization: Anxiety Depression Medication stabilization Suicidal ideation  Estimated Length of Stay:  1-7 days  Last 3 [place] Suicide Severity Risk Score: Flowsheet Row Admission (Current) from 12/08/2022 in [facility] INPATIENT BEHAVIORAL MEDICINE Most recent reading at 12/08/2022 11:27 PM ED from 12/08/2022 in [facility] Emergency Department at [facility] Most recent reading at 12/08/2022  1:00 AM  C-SSRS RISK CATEGORY Low Risk No Risk       Last PHQ 2/9 Scores:     No data to display          Scribe for Treatment Team: Harden Mo, LCSW 12/10/2022 12:40 PM

## 2022-12-10 NOTE — Plan of Care (Signed)
Problem: Education: Goal: Knowledge of General Education information will improve Description: Including pain rating scale, medication(s)/side effects and non-pharmacologic comfort measures Outcome: Adequate for Discharge   Problem: Health Behavior/Discharge Planning: Goal: Ability to manage health-related needs will improve Outcome: Adequate for Discharge   Problem: Clinical Measurements: Goal: Ability to maintain clinical measurements within normal limits will improve Outcome: Adequate for Discharge Goal: Will remain free from infection Outcome: Adequate for Discharge Goal: Diagnostic test results will improve Outcome: Adequate for Discharge Goal: Respiratory complications will improve Outcome: Adequate for Discharge Goal: Cardiovascular complication will be avoided Outcome: Adequate for Discharge   Problem: Activity: Goal: Risk for activity intolerance will decrease Outcome: Adequate for Discharge   Problem: Nutrition: Goal: Adequate nutrition will be maintained Outcome: Adequate for Discharge   Problem: Coping: Goal: Level of anxiety will decrease Outcome: Adequate for Discharge   Problem: Elimination: Goal: Will not experience complications related to bowel motility Outcome: Adequate for Discharge Goal: Will not experience complications related to urinary retention Outcome: Adequate for Discharge   Problem: Pain Managment: Goal: General experience of comfort will improve Outcome: Adequate for Discharge   Problem: Safety: Goal: Ability to remain free from injury will improve Outcome: Adequate for Discharge   Problem: Skin Integrity: Goal: Risk for impaired skin integrity will decrease Outcome: Adequate for Discharge   Problem: Education: Goal: Knowledge of [place] General Education information/materials will improve Outcome: Adequate for Discharge Goal: Emotional status will improve Outcome: Adequate for Discharge Goal: Mental status will  improve Outcome: Adequate for Discharge Goal: Verbalization of understanding the information provided will improve Outcome: Adequate for Discharge   Problem: Activity: Goal: Interest or engagement in activities will improve Outcome: Adequate for Discharge Goal: Sleeping patterns will improve Outcome: Adequate for Discharge   Problem: Coping: Goal: Ability to verbalize frustrations and anger appropriately will improve Outcome: Adequate for Discharge Goal: Ability to demonstrate self-control will improve Outcome: Adequate for Discharge   Problem: Health Behavior/Discharge Planning: Goal: Identification of resources available to assist in meeting health care needs will improve Outcome: Adequate for Discharge Goal: Compliance with treatment plan for underlying cause of condition will improve Outcome: Adequate for Discharge   Problem: Physical Regulation: Goal: Ability to maintain clinical measurements within normal limits will improve Outcome: Adequate for Discharge   Problem: Safety: Goal: Periods of time without injury will increase Outcome: Adequate for Discharge   Problem: Education: Goal: Utilization of techniques to improve thought processes will improve Outcome: Adequate for Discharge Goal: Knowledge of the prescribed therapeutic regimen will improve Outcome: Adequate for Discharge   Problem: Activity: Goal: Interest or engagement in leisure activities will improve Outcome: Adequate for Discharge Goal: Imbalance in normal sleep/wake cycle will improve Outcome: Adequate for Discharge   Problem: Coping: Goal: Coping ability will improve Outcome: Adequate for Discharge Goal: Will verbalize feelings Outcome: Adequate for Discharge   Problem: Health Behavior/Discharge Planning: Goal: Ability to make decisions will improve Outcome: Adequate for Discharge Goal: Compliance with therapeutic regimen will improve Outcome: Adequate for Discharge   Problem: Role  Relationship: Goal: Will demonstrate positive changes in social behaviors and relationships Outcome: Adequate for Discharge   Problem: Safety: Goal: Ability to disclose and discuss suicidal ideas will improve Outcome: Adequate for Discharge Goal: Ability to identify and utilize support systems that promote safety will improve Outcome: Adequate for Discharge   Problem: Self-Concept: Goal: Will verbalize positive feelings about self Outcome: Adequate for Discharge Goal: Level of anxiety will decrease Outcome: Adequate for Discharge   Problem: Education: Goal: Ability  to make informed decisions regarding treatment will improve Outcome: Adequate for Discharge   Problem: Coping: Goal: Coping ability will improve Outcome: Adequate for Discharge   Problem: Health Behavior/Discharge Planning: Goal: Identification of resources available to assist in meeting health care needs will improve Outcome: Adequate for Discharge   Problem: Medication: Goal: Compliance with prescribed medication regimen will improve Outcome: Adequate for Discharge   Problem: Self-Concept: Goal: Ability to disclose and discuss suicidal ideas will improve Outcome: Adequate for Discharge Goal: Will verbalize positive feelings about self Outcome: Adequate for Discharge

## 2022-12-10 NOTE — Progress Notes (Signed)
Discharge Note:  Patient denies SI/HI/AVH at this time. Discharge instructions, AVS, prescriptions, and transition record gone over with patient. Patient agrees to comply with medication management, follow-up visit, and outpatient therapy. Patient belongings returned to patient. No questions or concerns at present.. Patient ambulatory off unit. Patient discharged to home with arranged transportation.

## 2022-12-10 NOTE — Discharge Summary (Signed)
Physician Discharge Summary Note  Patient:  Javier Garza is an 37 y.o., male MRN:  [id number] DOB:  12/08/1985 Patient phone:  [phone] (home)  Patient address:   [address] [place] [address],  Total Time spent with patient: 25 minutes  Date of Admission:  12/08/2022 Date of Discharge: 12/10/2022  Hospital course: Patient requested discharge today.  He is voluntarily denies any suicidal ideations.  His sleep has improved.  He is tolerating the Remeron well slept deeply than usual.  Still wanted to continue his Wellbutrin.  States that the trazodone was causing him to feel spaced out.  Patient denies any suicidal thoughts.  He is alert and x 3.  He is not holdable at this time.  He is here voluntarily.  Patient of suicide or homicide is please call 911 or go to local emergency department.  Patient voices agreement and understanding.  HPI: Javier Garza is a 37 year old male with multiple depressive episodes, worsening over the past 6 months along with suicidal ideations recently.  He is soft-spoken and mild mannered.  Reports that he has been on Prozac Lexapro and Zoloft but has been off of all psychiatric medications for 15 years due to limited benefit and not tolerating them well.  Over the past couple of months he has tried exercising and taking vitamin D as ways to mitigate depression and anxiety.   Patient reports financial problems as well as continued toxic relationship with his ex partner who is described to be narcissistic.  Patient reports a longstanding history of sleep issues, usually works second and third shift.  He reports a lack of joy in his life and poor concentration.  Reports feeling exhausted with low motivation.  He has lost 20 pounds in the past 8 months.  Denies any history of psychosis or mania.  Contracts for safety at this time. Principal Problem: MDD (major depressive disorder) Discharge Diagnoses: Principal Problem:   MDD (major depressive  disorder)   Past Medical History:  Past Medical History:  Diagnosis Date   Anxiety    Depression     Past Surgical History:  Procedure Laterality Date   APPENDECTOMY     LAPAROSCOPIC APPENDECTOMY N/A 02/15/2013   Procedure: APPENDECTOMY LAPAROSCOPIC;  Surgeon: Velora Heckler, MD;  Location: [facility];  Service: General;  Laterality: N/A;   Family History: History reviewed. No pertinent family history.  Social History:  Social History   Substance and Sexual Activity  Alcohol Use No   Comment: rare     Social History   Substance and Sexual Activity  Drug Use No    Social History   Socioeconomic History   Marital status: Single    Spouse name: Not on file   Number of children: Not on file   Years of education: Not on file   Highest education level: Not on file  Occupational History   Not on file  Tobacco Use   Smoking status: Former    Packs/day: 1    Types: Cigarettes   Smokeless tobacco: Former    Quit date: 12/09/2010  Substance and Sexual Activity   Alcohol use: No    Comment: rare   Drug use: No   Sexual activity: Not on file  Other Topics Concern   Not on file  Social History Narrative   Not on file   Social Determinants of Health   Financial Resource Strain: Not on file  Food Insecurity: Food Insecurity Present (12/08/2022)   Hunger Vital Sign  Worried About Programme researcher, broadcasting/film/video in the Last Year: Never true    Ran Out of Food in the Last Year: Sometimes true  Transportation Needs: No Transportation Needs (12/08/2022)   PRAPARE - Administrator, Civil Service (Medical): No    Lack of Transportation (Non-Medical): No  Physical Activity: Not on file  Stress: Not on file  Social Connections: Not on file     Physical Findings: AIMS:  , ,  ,  ,    CIWA:    COWS:     Musculoskeletal: Strength & Muscle Tone: within normal limits Gait & Station: normal Patient leans: Right   Psychiatric Specialty Exam:  Presentation  General  Appearance:  Appropriate for Environment  Eye Contact: Good  Speech: Clear and Coherent  Speech Volume: Decreased  Handedness:No data recorded  Mood and Affect  Mood: Depressed; Hopeless  Affect: Flat; Tearful   Thought Process  Thought Processes: Coherent; Goal Directed  Descriptions of Associations:Intact  Orientation:Full (Time, Place and Person)  Thought Content:WDL; Logical  History of Schizophrenia/Schizoaffective disorder:No data recorded Duration of Psychotic Symptoms:No data recorded Hallucinations:No data recorded Ideas of Reference:None  Suicidal Thoughts:No data recorded Homicidal Thoughts:No data recorded  Sensorium  Memory: Immediate Fair; Recent Fair  Judgment: Fair  Insight: Fair   Art therapist  Concentration: Fair  Attention Span: Fair  Recall: Good  Fund of Knowledge: Good  Language: Good   Psychomotor Activity  Psychomotor Activity:No data recorded  Assets  Assets: Desire for Improvement; Physical Health; Resilience; Social Support   Sleep  Sleep:No data recorded   Physical Exam: Physical Exam ROS Blood pressure 105/62, pulse (!) 58, temperature 97.7 F (36.5 C), temperature source Oral, resp. rate (!) 21, height 6\' 2"  (1.88 m), weight 82.1 kg, SpO2 99 %. Body mass index is 23.24 kg/m.   Social History   Tobacco Use  Smoking Status Former   Packs/day: 1   Types: Cigarettes  Smokeless Tobacco Former   Quit date: 12/09/2010   Tobacco Cessation:  N/A, patient does not currently use tobacco products   Blood Alcohol level:  Lab Results  Component Value Date   ETH <10 12/08/2022   ETH <11 12/21/2011    Metabolic Disorder Labs:  No results found for: "HGBA1C", "MPG" No results found for: "PROLACTIN" No results found for: "CHOL", "TRIG", "HDL", "CHOLHDL", "VLDL", "LDLCALC"  See Psychiatric Specialty Exam and Suicide Risk Assessment completed by Attending Physician prior to  discharge.  Discharge destination:  Home  Is patient on multiple antipsychotic therapies at discharge:  No   Has Patient had three or more failed trials of antipsychotic monotherapy by history:  No  Recommended Plan for Multiple Antipsychotic Therapies: NA  Discharge Instructions     Diet - low sodium heart healthy   Complete by: As directed    Increase activity slowly   Complete by: As directed       Allergies as of 12/10/2022       Reactions   Lactose Intolerance (gi) Other (See Comments)   Gi upset         Medication List     TAKE these medications      Indication  buPROPion 150 MG 24 hr tablet Commonly known as: WELLBUTRIN XL Take 1 tablet (150 mg total) by mouth daily.  Indication: Major Depressive Disorder   mirtazapine 15 MG tablet Commonly known as: REMERON Take 1 tablet (15 mg total) by mouth at bedtime.  Indication: Major Depressive Disorder  Signed: Reggie Pile, MD 12/10/2022, 8:12 AM

## 2022-12-10 NOTE — Progress Notes (Signed)
  [facility] Adult Case Management Discharge Plan :  Will you be returning to the same living situation after discharge:  Yes,  pt will be returning home  At discharge, do you have transportation home?: Yes,  CSW will get pt taxi to [facility] in [place], where his car is located  Do you have the ability to pay for your medications: Yes,  pt has [place] MEDICAID PREPAID HEALTH PLAN / [place] MEDICAID [employer] CARITAS OF [place]  Release of information consent forms completed and in the chart;  Patient's signature needed at discharge.  Patient to Follow up at:  Follow-up Information     [facility] Follow up.   Why: Your appointment is Thursday July 11th @ 2:30 PM. Please plan to spend a few hours at the appointment and remember to bring your insurance card. Contact information: [address] [place] [address] [phone]                 Next level of care provider has access to [facility] Link:no  Safety Planning and Suicide Prevention discussed: Yes,  pt declined      Has patient been referred to the Quitline?: Patient refused referral for treatment  Patient has been referred for addiction treatment: No known substance use disorder.  [address], LCSWA 12/10/2022, 11:10 AM

## 2022-12-10 NOTE — Group Note (Signed)
Recreation Therapy Group Note   Group Topic:Problem Solving  Group Date: 12/10/2022 Start Time: 1000 End Time: 1050 Facilitators: Rosina Lowenstein, LRT, CTRS Location:  Craft Room  Group Description: Life Boat. Patients were given the scenario that they are on a boat that is about to become shipwrecked, leaving them stranded on an [place]. They are asked to make a list of 15 different items that they want to take with them when they are stranded on the [place]. Patients are asked to rank their items from most important to least important, #1 being the most important and #15 being the least. Patients will work individually for the first round to come up with 15 items and then pair up with a peer(s) to condense their list and come up with one list of 15 items between the two of them. Patients or LRT will read aloud the 15 different items to the group after each round. LRT facilitated post-activity processing to discuss how this activity can be used in daily life post discharge.   Goal Area(s) Addressed:  Patient will identify priorities, wants and needs. Patient will communicate with LRT and peers. Patient will work collectively as a Administrator, Civil Service. Patient will work on Product manager.   Affect/Mood: N/A   Participation Level: Did not attend    Clinical Observations/Individualized Feedback: Ryann did not attend group.  Plan: Continue to engage patient in RT group sessions 2-3x/week.   Rosina Lowenstein, LRT, CTRS 12/10/2022 11:30 AM

## 2022-12-10 NOTE — Plan of Care (Signed)
  Problem: Education: Goal: Knowledge of General Education information will improve Description: Including pain rating scale, medication(s)/side effects and non-pharmacologic comfort measures Outcome: Progressing   Problem: Coping: Goal: Level of anxiety will decrease Outcome: Progressing   Problem: Safety: Goal: Ability to remain free from injury will improve Outcome: Progressing   Problem: Education: Goal: Emotional status will improve Outcome: Not Progressing   Problem: Coping: Goal: Ability to demonstrate self-control will improve Outcome: Progressing   Problem: Health Behavior/Discharge Planning: Goal: Compliance with treatment plan for underlying cause of condition will improve Outcome: Progressing

## 2023-01-05 DIAGNOSIS — F331 Major depressive disorder, recurrent, moderate: Secondary | ICD-10-CM | POA: Diagnosis not present

## 2023-01-05 NOTE — BHH Counselor (Addendum)
CSW faxed Scarlette Calico at [facility] [phone]) discharge summary per request of VM sent by Scarlette Calico this morning.   Reynaldo Minium, MSW, [place] 01/05/2023 8:47 AM

## 2023-01-27 DIAGNOSIS — F331 Major depressive disorder, recurrent, moderate: Secondary | ICD-10-CM | POA: Diagnosis not present

## 2023-03-28 DIAGNOSIS — F331 Major depressive disorder, recurrent, moderate: Secondary | ICD-10-CM | POA: Diagnosis not present

## 2023-04-15 DIAGNOSIS — Z113 Encounter for screening for infections with a predominantly sexual mode of transmission: Secondary | ICD-10-CM | POA: Diagnosis not present

## 2023-04-15 DIAGNOSIS — N341 Nonspecific urethritis: Secondary | ICD-10-CM | POA: Diagnosis not present

## 2023-04-15 DIAGNOSIS — Z114 Encounter for screening for human immunodeficiency virus [HIV]: Secondary | ICD-10-CM | POA: Diagnosis not present

## 2023-05-05 ENCOUNTER — Ambulatory Visit: Payer: 59

## 2023-05-06 ENCOUNTER — Ambulatory Visit
Admission: RE | Admit: 2023-05-06 | Discharge: 2023-05-06 | Disposition: A | Discharge status: Home / Self Care | Payer: Medicaid Other | Source: Ambulatory Visit | Attending: Internal Medicine | Admitting: Internal Medicine

## 2023-05-06 VITALS — BP 116/74 | HR 56 | Temp 97.9°F | Resp 17

## 2023-05-06 DIAGNOSIS — N451 Epididymitis: Secondary | ICD-10-CM | POA: Diagnosis not present

## 2023-05-06 DIAGNOSIS — R369 Urethral discharge, unspecified: Secondary | ICD-10-CM | POA: Diagnosis not present

## 2023-05-06 LAB — POCT URINALYSIS DIP (MANUAL ENTRY)
Bilirubin, UA: NEGATIVE
Blood, UA: NEGATIVE
Glucose, UA: NEGATIVE mg/dL
Ketones, POC UA: NEGATIVE mg/dL
Leukocytes, UA: NEGATIVE
Nitrite, UA: NEGATIVE
Protein Ur, POC: NEGATIVE mg/dL
Spec Grav, UA: 1.025 (ref 1.010–1.025)
Urobilinogen, UA: 0.2 U/dL
pH, UA: 5.5 (ref 5.0–8.0)

## 2023-05-06 MED ORDER — AZITHROMYCIN 500 MG PO TABS
1000.0000 mg | ORAL_TABLET | Freq: Once | ORAL | Status: AC
  Administered 2023-05-06: 1000 mg via ORAL

## 2023-05-06 MED ORDER — CEFTRIAXONE SODIUM 500 MG IJ SOLR
500.0000 mg | INTRAMUSCULAR | Status: DC
  Administered 2023-05-06: 500 mg via INTRAMUSCULAR

## 2023-05-06 NOTE — ED Triage Notes (Signed)
Pt presents with c/o testicle pain, penile discharge, frequency X few weeks  States he was recently exposed to Chlamydia.   Was treated for it 10/30 and still has sxs.

## 2023-05-06 NOTE — ED Provider Notes (Signed)
[facility]    CSN: [id number] Arrival date & time: 05/06/23  1011      History   Chief Complaint Chief Complaint  Patient presents with   Testicle Pain    Entered by patient    HPI Javier Garza is a 37 y.o. male Zen presents for penile discharge and testicular pain.  Patient reports 2 weeks of a penile discharge with a right sided testicular pain as well as urinary frequency.  He denies any fevers, chills, hematuria, testicular swelling or erythema, difficulty urinating, nausea/vomiting, flank pain.  States he was treated through the health department at the end of October for positive chlamydia.  States he took 7 days of doxycycline with resolution of symptoms.  He denies any new exposure or concern since previous treatment.  No other concerns at this time.   Testicle Pain    Past Medical History:  Diagnosis Date   Anxiety    Depression     Patient Active Problem List   Diagnosis Date Noted   MDD (major depressive disorder), recurrent episode, severe (HCC) 12/08/2022   MDD (major depressive disorder) 12/08/2022   Appendicitis, acute 02/15/2013    Past Surgical History:  Procedure Laterality Date   APPENDECTOMY     LAPAROSCOPIC APPENDECTOMY N/A 02/15/2013   Procedure: APPENDECTOMY LAPAROSCOPIC;  Surgeon: Velora Heckler, MD;  Location: [facility];  Service: General;  Laterality: N/A;       Home Medications    Prior to Admission medications   Medication Sig Start Date End Date Taking? Authorizing Provider  buPROPion (WELLBUTRIN XL) 150 MG 24 hr tablet Take 1 tablet (150 mg total) by mouth daily. 12/10/22 01/09/23  Reggie Pile, MD  mirtazapine (REMERON) 15 MG tablet Take 1 tablet (15 mg total) by mouth at bedtime. 12/10/22 01/09/23  Reggie Pile, MD    Family History History reviewed. No pertinent family history.  Social History Social History   Tobacco Use   Smoking status: Former    Current packs/day: 1.00    Types: Cigarettes   Smokeless tobacco:  Former    Quit date: 12/09/2010  Substance Use Topics   Alcohol use: No    Comment: rare   Drug use: No     Allergies   Lactose intolerance (gi)   Review of Systems Review of Systems  Genitourinary:  Positive for penile discharge and testicular pain.     Physical Exam Triage Vital Signs ED Triage Vitals  Encounter Vitals Group     BP 05/06/23 1028 116/74     Systolic BP Percentile --      Diastolic BP Percentile --      Pulse Rate 05/06/23 1028 (!) 56     Resp 05/06/23 1028 17     Temp 05/06/23 1028 97.9 F (36.6 C)     Temp Source 05/06/23 1028 Oral     SpO2 05/06/23 1028 98 %     Weight --      Height --      Head Circumference --      Peak Flow --      Pain Score 05/06/23 1027 2     Pain Loc --      Pain Education --      Exclude from Growth Chart --    No data found.  Updated Vital Signs BP 116/74 (BP Location: Right Arm)   Pulse (!) 56   Temp 97.9 F (36.6 C) (Oral)   Resp 17   SpO2 98%  Visual Acuity Right Eye Distance:   Left Eye Distance:   Bilateral Distance:    Right Eye Near:   Left Eye Near:    Bilateral Near:     Physical Exam Vitals and nursing note reviewed. Chaperone present: Health and safety inspector.  Constitutional:      General: He is not in acute distress.    Appearance: Normal appearance. He is not ill-appearing.  HENT:     Head: Normocephalic and atraumatic.  Eyes:     Pupils: Pupils are equal, round, and reactive to light.  Cardiovascular:     Rate and Rhythm: Normal rate.  Pulmonary:     Effort: Pulmonary effort is normal.  Genitourinary:    Penis: Circumcised.      Testes: Cremasteric reflex is present.        Right: Tenderness present. Mass or swelling not present. Cremasteric reflex is present.         Left: Mass, tenderness or swelling not present. Cremasteric reflex is present.      Comments: Mild tenderness along the right epididymitis Skin:    General: Skin is warm and dry.  Neurological:     General: No focal deficit  present.     Mental Status: He is alert and oriented to person, place, and time.  Psychiatric:        Mood and Affect: Mood normal.        Behavior: Behavior normal.      UC Treatments / Results  Labs (all labs ordered are listed, but only abnormal results are displayed) Labs Reviewed  POCT URINALYSIS DIP (MANUAL ENTRY)  CYTOLOGY, (ORAL, ANAL, URETHRAL) ANCILLARY ONLY    EKG   Radiology No results found.  Procedures Procedures (including critical care time)  Medications Ordered in UC Medications  cefTRIAXone (ROCEPHIN) injection 500 mg (has no administration in time range)  azithromycin (ZITHROMAX) tablet 1,000 mg (has no administration in time range)    Initial Impression / Assessment and Plan / UC Course  I have reviewed the triage vital signs and the nursing notes.  Pertinent labs & imaging results that were available during my care of the patient were reviewed by me and considered in my medical decision making (see chart for details).     Reviewed exam and symptoms with patient.  No red flags.  STD testing as ordered and will contact for any positive results.  Will treat for epididymitis/possible reoccurring chlamydia with IM ceftriaxone and Zithromax x 1 dose.  Patient declined HIV or syphilis testing as he recently had this done as was negative.  Advised PCP follow-up if symptoms do not improve.  Strict ER precautions reviewed. Final Clinical Impressions(s) / UC Diagnoses   Final diagnoses:  Penile discharge  Epididymitis   Discharge Instructions   None    ED Prescriptions   None    PDMP not reviewed this encounter.   Radford Pax, NP 05/06/23 1115

## 2023-05-06 NOTE — Discharge Instructions (Signed)
The clinic will contact you with results of the STD testing done today if positive.  Please follow-up with your PCP if your symptoms do not improve.  Please go to the ER for any worsening symptoms.  I hope you feel better soon!

## 2023-05-07 LAB — CYTOLOGY, (ORAL, ANAL, URETHRAL) ANCILLARY ONLY
Chlamydia: NEGATIVE
Comment: NEGATIVE
Comment: NEGATIVE
Comment: NORMAL
Neisseria Gonorrhea: NEGATIVE
Trichomonas: NEGATIVE

## 2023-10-28 ENCOUNTER — Ambulatory Visit
Admission: EM | Admit: 2023-10-28 | Discharge: 2023-10-28 | Disposition: A | Discharge status: Home / Self Care | Payer: Self-pay | Attending: Family Medicine | Admitting: Family Medicine

## 2023-10-28 DIAGNOSIS — Z7251 High risk heterosexual behavior: Secondary | ICD-10-CM | POA: Insufficient documentation

## 2023-10-28 DIAGNOSIS — N453 Epididymo-orchitis: Secondary | ICD-10-CM | POA: Insufficient documentation

## 2023-10-28 MED ORDER — DOXYCYCLINE HYCLATE 100 MG PO CAPS: 100.0000 mg | ORAL_CAPSULE | Freq: Two times a day (BID) | ORAL | 0 refills | Status: AC

## 2023-10-28 MED ORDER — CEFTRIAXONE SODIUM 500 MG IJ SOLR
500.0000 mg | INTRAMUSCULAR | Status: DC
  Administered 2023-10-28: 500 mg via INTRAMUSCULAR

## 2023-10-28 NOTE — ED Provider Notes (Signed)
 [facility]  Note:  This document was prepared using Conservation officer, historic buildings and may include unintentional dictation errors.  MRN: [id number] DOB: 15-Sep-1985  Subjective:   Javier Garza is a 38 y.o. male presenting for history of right testicle pain.  Patient recently had unprotected sex 3 days ago.  Would like to get tested except for HIV and syphilis testing.  Denies dysuria, hematuria, urinary frequency, penile discharge, penile swelling, testicular swelling, anal pain, groin pain.  No rashes.  No current facility-administered medications for this encounter.  Current Outpatient Medications:    buPROPion  (WELLBUTRIN  XL) 150 MG 24 hr tablet, Take 1 tablet (150 mg total) by mouth daily., Disp: 30 tablet, Rfl: 0   mirtazapine  (REMERON ) 15 MG tablet, Take 1 tablet (15 mg total) by mouth at bedtime., Disp: 30 tablet, Rfl: 0   Allergies  Allergen Reactions   Lactose Intolerance (Gi) Other (See Comments)    Gi upset     Past Medical History:  Diagnosis Date   Anxiety    Depression      Past Surgical History:  Procedure Laterality Date   APPENDECTOMY     LAPAROSCOPIC APPENDECTOMY N/A 02/15/2013   Procedure: APPENDECTOMY LAPAROSCOPIC;  Surgeon: Keitha Pata, MD;  Location: [facility];  Service: General;  Laterality: N/A;    History reviewed. No pertinent family history.  Social History   Tobacco Use   Smoking status: Former    Current packs/day: 1.00    Types: Cigarettes   Smokeless tobacco: Former    Quit date: 12/09/2010  Substance Use Topics   Alcohol use: No    Comment: rare   Drug use: No    ROS   Objective:   Vitals: BP 108/72 (BP Location: Right Arm)   Pulse 72   Temp 99.3 F (37.4 C) (Oral)   Resp 18   SpO2 98%   Physical Exam Constitutional:      General: He is not in acute distress.    Appearance: Normal appearance. He is well-developed and normal weight. He is not ill-appearing, toxic-appearing or diaphoretic.  HENT:      Head: Normocephalic and atraumatic.     Right Ear: External ear normal.     Left Ear: External ear normal.     Nose: Nose normal.     Mouth/Throat:     Pharynx: Oropharynx is clear.  Eyes:     General: No scleral icterus.       Right eye: No discharge.        Left eye: No discharge.     Extraocular Movements: Extraocular movements intact.  Cardiovascular:     Rate and Rhythm: Normal rate.  Pulmonary:     Effort: Pulmonary effort is normal.  Genitourinary:    Penis: Circumcised. No phimosis, paraphimosis, hypospadias, erythema, tenderness, discharge, swelling or lesions.      Testes:        Right: Tenderness present.  Musculoskeletal:     Cervical back: Normal range of motion.  Neurological:     Mental Status: He is alert and oriented to person, place, and time.  Psychiatric:        Mood and Affect: Mood normal.        Behavior: Behavior normal.        Thought Content: Thought content normal.        Judgment: Judgment normal.    IM ceftriaxone  500 mg administered in clinic.  Assessment and Plan :   PDMP not reviewed  this encounter.  1. Epididymo-orchitis   2. Unprotected sex    Patient treated empirically as per CDC guidelines with IM ceftriaxone , doxycycline  as an outpatient.  Labs pending.   Counseled on safe sex practices including abstaining for 1 week following treatment.  Counseled patient on potential for adverse effects with medications prescribed/recommended today, ER and return-to-clinic precautions discussed, patient verbalized understanding.    Adolph Hoop, [place] 10/28/23 [phone]

## 2023-10-28 NOTE — Discharge Instructions (Signed)
 Avoid all forms of sexual intercourse (oral, vaginal, anal) for the next 7 days to avoid spreading/reinfecting or at least until we can see what kinds of infection results are positive.  Abstaining for 2 weeks would be better but at least 1 week is required.  We will let you know about your test results from the swab we did today and if you need any prescriptions for antibiotics or changes to your treatment from today.

## 2023-10-28 NOTE — ED Triage Notes (Signed)
 Pt reports he has been having unprotected intercourse and just wants to ensure he didn't catch anything.   States he has right testicle pain  Last encounter x 2 days

## 2023-10-29 LAB — CYTOLOGY, (ORAL, ANAL, URETHRAL) ANCILLARY ONLY
Chlamydia: NEGATIVE
Comment: NEGATIVE
Comment: NEGATIVE
Comment: NORMAL
Neisseria Gonorrhea: NEGATIVE
Trichomonas: NEGATIVE
